# Patient Record
Sex: Female | Born: 1990 | Race: Black or African American | Hispanic: No | Marital: Single | Smoking: Never smoker
Health system: Southern US, Community
[De-identification: ages and names within clinical notes are randomized; demographics above are authoritative.]

## PROBLEM LIST (undated history)

## (undated) DIAGNOSIS — D649 Anemia, unspecified: Secondary | ICD-10-CM

## (undated) DIAGNOSIS — Z789 Other specified health status: Secondary | ICD-10-CM

## (undated) HISTORY — PX: NO PAST SURGERIES: SHX2092

---
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
## 2011-02-14 ENCOUNTER — Inpatient Hospital Stay: Payer: Self-pay | Admitting: Advanced Practice Midwife

## 2011-02-14 ENCOUNTER — Observation Stay: Payer: Self-pay

## 2011-06-21 ENCOUNTER — Emergency Department: Payer: Self-pay | Admitting: Emergency Medicine

## 2012-02-21 ENCOUNTER — Emergency Department
Admission: EM | Admit: 2012-02-21 | Discharge: 2012-02-21 | Disposition: A | Discharge status: Home Health | Payer: Medicaid Other | Source: Home / Self Care

## 2012-02-21 DIAGNOSIS — L739 Follicular disorder, unspecified: Secondary | ICD-10-CM

## 2012-02-21 DIAGNOSIS — L738 Other specified follicular disorders: Secondary | ICD-10-CM

## 2012-02-21 DIAGNOSIS — B9689 Other specified bacterial agents as the cause of diseases classified elsewhere: Secondary | ICD-10-CM

## 2012-02-21 MED ORDER — METRONIDAZOLE 500 MG PO TABS: 500.0000 mg | ORAL_TABLET | Freq: Two times a day (BID) | ORAL | Status: AC

## 2012-02-21 MED ORDER — MUPIROCIN 2 % EX OINT: TOPICAL_OINTMENT | Freq: Three times a day (TID) | CUTANEOUS | Status: AC

## 2012-02-21 NOTE — ED Notes (Signed)
Pt c/o painful lesion on L vagina.  Pt states she noticed it yesterday.  Describes as a "bump"  Pt states she attempted to express it with no success.  Pt states pain increases with walking, sitting.  Denies any discharge, itching or odor.

## 2012-02-21 NOTE — ED Provider Notes (Signed)
History     CSN: [id number]  Arrival date & time 02/21/12  6295   First MD Initiated Contact with Patient 02/21/12 1918      Chief Complaint  Patient presents with  . Groin Swelling    (Consider location/radiation/quality/duration/timing/severity/associated sxs/prior treatment) HPI Comments: Vaginal bump x 2 days.  Has noticed irritation and mild tenderness.  No fevers, chills.  No vaginal discharge.  No dysuria.  Recently shaved vaginal area.  Irritation is near area of shaving.  No prior history of genital warts, HSV.      History reviewed. No pertinent past medical history.  History reviewed. No pertinent past surgical history.  No family history on file.  History  Substance Use Topics  . Smoking status: Not on file  . Smokeless tobacco: Not on file  . Alcohol Use: No    OB History    Grav Para Term Preterm Abortions TAB SAB Ect Mult Living                  Review of Systems  All other systems reviewed and are negative.    Allergies  Review of patient's allergies indicates no known allergies.  Home Medications   Current Outpatient Rx  Name Route Sig Dispense Refill  . METRONIDAZOLE 500 MG PO TABS Oral Take 1 tablet (500 mg total) by mouth 2 (two) times daily. 14 tablet 0  . MUPIROCIN 2 % EX OINT Topical Apply topically 3 (three) times daily. 22 g 0    BP 114/79  Pulse 84  Temp 98.5 F (36.9 C) (Oral)  Resp 15  Ht 5' 1.5" (1.562 m)  Wt 124 lb (56.246 kg)  BMI 23.05 kg/m2  SpO2 100%  Physical Exam  Constitutional: She appears well-developed and well-nourished.  HENT:  Head: Normocephalic and atraumatic.  Eyes: Pupils are equal, round, and reactive to light.  Neck: Normal range of motion. Neck supple.  Cardiovascular: Normal rate and regular rhythm.   Pulmonary/Chest: Effort normal and breath sounds normal.  Abdominal: Soft. Bowel sounds are normal.  Genitourinary:          2 small denuded areas on L labia majora.  Mild TTP over  denuded areas No erythema or purulent drainage.  Marked fishy odor.     ED Course  Procedures (including critical care time)  Labs Reviewed - No data to display No results found.   1. Folliculitis   2. BV (bacterial vaginosis)       MDM  Will treat with warm compresses and topical bactroban.  Discussed shaving avoidance and skin care.  Case discussed with OB-GYN on call faculty.  Infectious red flags discussed.  Handout given.   Marked fishy odor on GYN exam. Will treat clinically with flagyl.  Handout given.  Follow up as needed.     The patient and/or caregiver has been counseled thoroughly with regard to treatment plan and/or medications prescribed including dosage, schedule, interactions, rationale for use, and possible side effects and they verbalize understanding. Diagnoses and expected course of recovery discussed and will return if not improved as expected or if the condition worsens. Patient and/or caregiver verbalized understanding.             Floydene Flock, MD 02/21/12 2013

## 2012-02-23 NOTE — ED Provider Notes (Signed)
Agree with exam, assessment, and plan.   Lattie Haw, MD 02/23/12 [phone]

## 2018-08-06 NOTE — L&D Delivery Note (Signed)
Delivery Note At 3:29 AM a viable female was delivered via Vaginal, Spontaneous (Presentation: ROA).  APGAR: 7, 8; weight pending.   Placenta status:spontanous, intact.  Cord: 3VC. Loose nuchal x 1 reduced on perineum without complications: .  Cord pH: N/A  Anesthesia:  Epidural Episiotomy: None Lacerations: None Suture Repair: none Est. Blood Loss (mL): 465  Mom to postpartum.  Baby to Couplet care / Skin to Skin.  Malachy Mood 02/01/2019, 3:58 AM

## 2018-10-24 ENCOUNTER — Encounter: Payer: Self-pay | Admitting: Advanced Practice Midwife

## 2018-11-04 ENCOUNTER — Ambulatory Visit (INDEPENDENT_AMBULATORY_CARE_PROVIDER_SITE_OTHER): Payer: Medicaid Other | Admitting: Advanced Practice Midwife

## 2018-11-04 ENCOUNTER — Other Ambulatory Visit: Payer: Self-pay

## 2018-11-04 ENCOUNTER — Encounter: Payer: Self-pay | Admitting: Advanced Practice Midwife

## 2018-11-04 DIAGNOSIS — Z3480 Encounter for supervision of other normal pregnancy, unspecified trimester: Secondary | ICD-10-CM

## 2018-11-04 DIAGNOSIS — Z3482 Encounter for supervision of other normal pregnancy, second trimester: Secondary | ICD-10-CM

## 2018-11-04 DIAGNOSIS — Z3481 Encounter for supervision of other normal pregnancy, first trimester: Secondary | ICD-10-CM

## 2018-11-04 NOTE — Progress Notes (Addendum)
New Obstetric Patient H&P    Chief Complaint: "Desires prenatal care"  Telephone interview: Patient is currently in her car and agrees to phone interview at this time I spoke with the patient from my office phone On call are Tresea MallJane Harles Evetts, CNM and [name] Time of call: 10:45 AM to 11:15 AM   History of Present Illness: Patient is a 28 y.o. [id number] Not Hispanic or Latino female, presents with amenorrhea and positive home pregnancy test. No LMP recorded (lmp unknown). Patient is pregnant. She has regular periods every 28 days lasting 4 days. She does not know when her last period was but thinks it may have been at the end of September or October making her approximately 22-[redacted] weeks pregnant. She has not taken a pregnancy test. She started feeling fetal movement about 1.5 months ago. Her last pap smear was 1 or 2 years ago at [facility] [place] and was no abnormalities. Will have patient sign release for records.   Since her LMP she claims she has experienced breast tenderness, fatigue, nausea. She denies vaginal bleeding. Her past medical history is noncontributory. Her prior pregnancies are notable for 2009 FT SVD female 7#, 2012 FT SVD female/SIDS at 97m, 532015 FT SVD female 6#, 2016 FT SVD female 6#  Since her LMP, she admits to the use of tobacco products  no She claims she has gained   "a few" pounds since the start of her pregnancy.  There are cats in the home  no  She admits close contact with children on a regular basis  yes  She has had chicken pox in the past unknown She has had Tuberculosis exposures, symptoms, or previously tested positive for TB   no Current or past history of domestic violence. no  Genetic Screening/Teratology Counseling: (Includes patient, baby's father, or anyone in either family with:)   1. [facility]  no 2. Thalassemia ([place], [place], Mediterranean, or Asian background): MCV<80  no 3. Neural tube defect  (meningomyelocele, spina bifida, anencephaly)  no 4. Congenital heart defect  no  5. Down syndrome  no 6. Tay-Sachs (Jewish, [place])  no 7. Canavan's Disease  no 8. Sickle cell disease or trait (African)  no  9. Hemophilia or other blood disorders  no  10. Muscular dystrophy  no  11. Cystic fibrosis  no  12. Huntington's Chorea  no  13. Mental retardation/autism  no 14. Other inherited genetic or chromosomal disorder  no 15. Maternal metabolic disorder (DM, PKU, etc)  no 16. Patient or FOB with a child with a birth defect not listed above no  16a. Patient or FOB with a birth defect themselves no 17. Recurrent pregnancy loss, or stillbirth  no  18. Any medications since LMP other than prenatal vitamins (include vitamins, supplements, OTC meds, drugs, alcohol)  no 19. Any other genetic/environmental exposure to discuss  no  Infection History:   1. Lives with someone with TB or TB exposed  no  2. Patient or partner has history of genital herpes  no 3. Rash or viral illness since LMP  no 4. History of STI (GC, CT, HPV, syphilis, HIV)  yes 5. History of recent travel :  no  Other pertinent information:  Patient is late to care due to uncertainty of keeping the pregnancy.     Review of Systems:10 point review of systems negative unless otherwise noted in HPI  Past Medical History:  History reviewed. No pertinent  past medical history.  Past Surgical History:  History reviewed. No pertinent surgical history.  Gynecologic History: No LMP recorded (lmp unknown). Patient is pregnant.  Obstetric History: [id number]  Family History:  Patient declined to speak in detail about family history Family History  Problem Relation Age of Onset  . Congestive Heart Failure Mother   . Diabetes Mother   . Diabetes Father     Social History:  Social History   Socioeconomic History  . Marital status: Single    Spouse name: Not on file  . Number of children: Not on file  . Years  of education: Not on file  . Highest education level: Not on file  Occupational History  . Not on file  Social Needs  . Financial resource strain: Not on file  . Food insecurity:    Worry: Not on file    Inability: Not on file  . Transportation needs:    Medical: Not on file    Non-medical: Not on file  Tobacco Use  . Smoking status: Never Smoker  . Smokeless tobacco: Never Used  Substance and Sexual Activity  . Alcohol use: No  . Drug use: No  . Sexual activity: Not on file  Lifestyle  . Physical activity:    Days per week: Not on file    Minutes per session: Not on file  . Stress: Not on file  Relationships  . Social connections:    Talks on phone: Not on file    Gets together: Not on file    Attends religious service: Not on file    Active member of club or organization: Not on file    Attends meetings of clubs or organizations: Not on file    Relationship status: Not on file  . Intimate partner violence:    Fear of current or ex partner: Not on file    Emotionally abused: Not on file    Physically abused: Not on file    Forced sexual activity: Not on file  Other Topics Concern  . Not on file  Social History Narrative  . Not on file    Allergies:  No Known Allergies  Medications: Prior to Admission medications   Not on File    Physical Exam Vitals: There were no vitals taken for this visit. Patient does not know her current weight Height 5' 1.5"  Telephone call/no physical exam Normal speaking voice, patient does not sound short of breath   Assessment: 28 y.o. [id number] at Unknown presenting to initiate prenatal care  Plan: 1) Avoid alcoholic beverages. 2) Patient encouraged not to smoke.  3) Discontinue the use of all non-medicinal drugs and chemicals.  4) Take prenatal vitamins daily.  5) Nutrition, food safety (fish, cheese advisories, and high nitrite foods) and exercise discussed. 6) Hospital and practice style discussed with cross coverage  system.  7) Genetic Screening, such as with 1st Trimester Screening, cell free fetal DNA, AFP testing, and Ultrasound, as well as with amniocentesis and CVS as appropriate, is discussed with patient. At the conclusion of today's visit patient declined genetic testing 8) Patient is asked about travel to areas at risk for the [place] virus, and counseled to avoid travel and exposure to mosquitoes or sexual partners who may have themselves been exposed to the virus. Testing is discussed, and will be ordered as appropriate.  9) In-office visit in 1 week for anatomy/dating scan, labs and ROB  Parke Poisson, CNM [facility] 11/04/18  4:24 PM

## 2018-11-12 ENCOUNTER — Other Ambulatory Visit (HOSPITAL_COMMUNITY)
Admission: RE | Admit: 2018-11-12 | Discharge: 2018-11-12 | Disposition: A | Discharge status: Home / Self Care | Payer: Medicaid Other | Source: Ambulatory Visit | Attending: Maternal Newborn | Admitting: Maternal Newborn

## 2018-11-12 ENCOUNTER — Encounter: Payer: Self-pay | Admitting: Maternal Newborn

## 2018-11-12 ENCOUNTER — Ambulatory Visit (INDEPENDENT_AMBULATORY_CARE_PROVIDER_SITE_OTHER): Payer: Medicaid Other

## 2018-11-12 ENCOUNTER — Other Ambulatory Visit: Payer: Self-pay

## 2018-11-12 ENCOUNTER — Ambulatory Visit (INDEPENDENT_AMBULATORY_CARE_PROVIDER_SITE_OTHER): Payer: Medicaid Other | Admitting: Maternal Newborn

## 2018-11-12 VITALS — BP 110/60 | Wt 149.0 lb

## 2018-11-12 DIAGNOSIS — Z3480 Encounter for supervision of other normal pregnancy, unspecified trimester: Secondary | ICD-10-CM | POA: Diagnosis not present

## 2018-11-12 DIAGNOSIS — Z3A26 26 weeks gestation of pregnancy: Secondary | ICD-10-CM | POA: Diagnosis not present

## 2018-11-12 DIAGNOSIS — O0932 Supervision of pregnancy with insufficient antenatal care, second trimester: Secondary | ICD-10-CM

## 2018-11-12 DIAGNOSIS — O0930 Supervision of pregnancy with insufficient antenatal care, unspecified trimester: Secondary | ICD-10-CM | POA: Insufficient documentation

## 2018-11-12 DIAGNOSIS — Z363 Encounter for antenatal screening for malformations: Secondary | ICD-10-CM

## 2018-11-12 LAB — POCT URINALYSIS DIPSTICK OB: Glucose, UA: NEGATIVE

## 2018-11-12 MED ORDER — CONCEPT DHA 53.5-38-1 MG PO CAPS: 1.0000 | ORAL_CAPSULE | Freq: Every day | ORAL | 4 refills | Status: DC

## 2018-11-12 NOTE — Progress Notes (Signed)
Routine Prenatal Care Visit  Subjective  Nicole Kaufman is a 28 y.o. [id number] at Unknown being seen today for ongoing prenatal care.  She is currently monitored for the following issues for this low-risk pregnancy and has Supervision of other normal pregnancy, antepartum on their problem list.  ----------------------------------------------------------------------------------- Patient reports no complaints.   Contractions: Not present. Vag. Bleeding: None.  Movement: Present. No leaking of fluid.  ----------------------------------------------------------------------------------- The following portions of the patient's history were reviewed and updated as appropriate: allergies, current medications, past family history, past medical history, past social history, past surgical history and problem list. Problem list updated.  Objective  Blood pressure 110/60, weight 149 lb (67.6 kg). Pregravid weight 140 lb (63.5 kg) Total Weight Gain 9 lb (4.082 kg) Body mass index is 27.7 kg/m.   Urinalysis: Urine dipstick shows negative for glucose, positive for protein (trace).  Fetal Status: Fetal Heart Rate (bpm): 154   Movement: Present  Presentation: Vertex  General:  Alert, oriented and cooperative. Patient is in no acute distress.  Skin: Skin is warm and dry. No rash noted.   Cardiovascular: Normal heart rate noted  Respiratory: Normal respiratory effort, no problems with respiration noted  Abdomen: Soft, gravid, appropriate for gestational age. Pain/Pressure: Absent     Pelvic:  Cervical exam deferred        Extremities: Normal range of motion.     Mental Status: Normal mood and affect. Normal behavior. Normal judgment and thought content.    Assessment   28 y.o. [id number] at 26w 1d by Ultrasound presenting for a routine prenatal visit.  Plan   Pregnancy # 5 Problems (from 11/04/18 to present)    Problem Noted Resolved   Supervision of other normal pregnancy, antepartum 11/04/2018  by Tresea MallGledhill, Jane, CNM No   Overview Addendum 11/04/2018  4:32 PM by Tresea MallGledhill, Jane, CNM    Clinic [facility] Prenatal Labs  Dating  Blood type:     Genetic Screen 1 Screen:    AFP:     Quad:     NIPS: Declined Antibody:   Anatomic US  Rubella:   Varicella: @VZVIGG @  GTT Early: NA Third trimester:  RPR:     Rhogam  HBsAg:     TDaP vaccine  Flu Shot: declined HIV:     Baby Food Formula                            GBS:   Contraception  [facility]ap: [facility]- sign for records [ ]   CBB     CS/VBAC NA   Support Person Friend Alda/contact person is her Steva ColderUncle Demond [phone] Late entry to care            Ultrasound today shows singleton IUP, dating 26w 1d, EDD 02/17/2019. Anatomy scan normal and complete, female infant in cephalic position.  Due to gestational age, will have NOB labs done with GTT in two weeks when she can return fasting.   Asked patient to sign records release today for Pap results.  Rx for Concept DHA vitamins, samples given. OTC medication list provided.  Discussed spacing of visits due to COVID-19 precautions; encouraged to stay in touch via MyChart/telephone for any concerns or questions. Written FAQ and more information about visit scheduling available in the After Visit Summary, which was printed and provided to patient at the conclusion of her visit.   Return in about 2 weeks (  around 11/26/2018) for ROB with GTT/labs.  Marcelyn Bruins, CNM 11/12/2018

## 2018-11-12 NOTE — Progress Notes (Signed)
ROB and U/S today- no concerns

## 2018-11-12 NOTE — Patient Instructions (Signed)
Given the current COVID-19 pandemic, our practice is making changes in how we are providing care to our patients. We are limiting in-person visits for the safety of all of our patients.   As a practice, we have met to discuss the best way to minimize visits, but still provide excellent care to our expecting mothers.  We have decided on the following visit structure for low-risk pregnancies.  Initial Pregnancy visit will be conducted as a telephone or web visit.  Between 10-14 weeks  there will be one in-person visit for an ultrasound, lab work, and genetic screening. 20 weeks in-person visit with an anatomy ultrasound  28 weeks in-person office visit for a 1-hour glucose test and a TDAP vaccination 32 weeks in-person office visit 34 weeks telephone visit 36 weeks in-person office visit for GBS, chlamydia, and gonorrhea testing 38 weeks in-person office visit 40 weeks in-person office visit  Understandably, some patients will require more visits than what is outlined above. Additional visits will be determined on a case-by-case basis.   We will, as always, be available for emergencies or to address concerns that might arise between in-person visits. We ask that you allow [place] the opportunity to address any concerns over the phone or through a virtual visit first. We will be available to return your phone calls throughout the day.   If you are able to purchase a scale, a blood pressure machine, and a home fetal doppler visits could be limited further. This will help decrease your exposure risks, but these purchases are not a necessity.   Things seem to change daily and there is the possibility that this structure could change, please be patient as we adapt to a new way of caring for patients.   Thank you for trusting [place] with your prenatal care. Our practice values you and looks forward to providing you with excellent care.   Sincerely,   [facility] [place]     COVID-19 and Your Pregnancy FAQ  How can I prevent infection with COVID-19 during my pregnancy? Social distancing is key. Please limit any interactions in public. Try and work from home if possible. Frequently wash your hands after touching possibly contaminated surfaces. Avoid touching your face.  Minimize trips to the store. Consider online ordering when possible.   Should I wear a mask? YES. It is recommended by the CDC that all people wear a cloth mask or facial covering in public. This will help reduce transmission as well as your risk or acquiring COVID-19. New studies are showing that even asymptomatic individuals can spread the virus from talking.   What are the symptoms of COVID-19? Fever (greater than 100.4 F), dry cough, shortness of breath.  Am I more at risk for COVID-19 since I am pregnant? There is not currently data showing that pregnant women are more adversely impacted by COVID-19 than the general population. However, we know that pregnant women tend to have worse respiratory complications from similar diseases such as the flu and SARS and for this reason should be considered an at-risk population.  What do I do if I am experiencing the symptoms of COVID-19? Testing is being limited because of test availability. If you are experiencing symptoms you should quarantine yourself, and the members of your family, for at least 2 weeks at home.   Please visit this website for more information: [email or web address].za.html  When should I go to the Emergency Room? Please go to the emergency room if you are  experiencing ANY of these symptoms*:  1.    Difficulty breathing or shortness of breath 2.    Persistent pain or pressure in the chest 3.    Confusion or difficulty being aroused (or awakened) 4.    Bluish lips or face  *This list is not all inclusive. Please consult our office for any other symptoms that are severe or  concerning.  What do I do if I am having difficulty breathing? You should go to the Emergency Room for evaluation. At this time they have a tent set up for evaluating patients with COVID-19 symptoms.   How will my prenatal care be different because of the COVID-19 pandemic? It has been recommended to reduce the frequency of face-to-face visits and use resources such as telephone and virtual visits when possible. Using a scale, blood pressure machine and fetal doppler at home can further help reduce face-to-face visits. You will be provided with additional information on this topic.  We ask that you come to your visits alone to minimize potential exposures to  COVID-19.  How can I receive childbirth education? At this time in-person classes have been cancelled. You can register for online childbirth education, breastfeeding, and newborn care classes.  Please visit:  CyberComps.hu for more information  How will my hospital birth experience be different? The hospital is currently limiting visitors. This means that while you are in labor you can only have one person at the hospital with you. Additional family members will not be allowed to wait in the building or outside your room. Your one support person can be the father of the baby, a relative, a doula, or a friend. Once one support person is designated that person will wear a band. This band cannot be shared with multiple people.  How long will I stay in the hospital for after giving birth? It is also recommended that discharge home be expedited during the COVID-19 outbreak. This means staying for 1 day after a vaginal delivery and 2 days after a cesarean section.  What if I have COVID-19 and I am in labor? We ask that you wear a mask while on labor and delivery. We will try and accommodate you being placed in a room that is capable of filtering the air. Please call ahead if you are in labor and on your way to the hospital. The  phone number for labor and delivery at [facility] is [phone].  If I have COVID-19 when my baby is born how can I prevent my baby from contracting COVID-19? This is an issue that will have to be discussed on a case-by-case basis. Current recommendations suggest providing separate isolation rooms for both the mother and new infant as well as limiting visitors. However, there are practical challenges to this recommendation. The situation will assuredly change and decisions will be influenced by the desires of the mother and availability of space.  Some suggestions are the use of a curtain or physical barrier between mom and infant, hand hygiene, mom wearing a mask, or 6 feet of spacing between a mom and infant.   Can I breastfeed during the COVID-19 pandemic?   Yes, breastfeeding is encouraged.  Can I breastfeed if I have COVID-19? Yes. Covid-19 has not been found in breast milk. This means you cannot give COVID-19 to your child through breast milk. Breast feeding will also help pass antibodies to fight infection to your baby.   What precautions should I take when breastfeeding if I  have COVID-19? If a mother and newborn do room-in and the mother wishes to feed at the breast, she should put on a facemask and practice hand hygiene before each feeding.  What precautions should I take when pumping if I have COVID-19? Prior to expressing breast milk, mothers should practice hand hygiene. After each pumping session, all parts that come into contact with breast milk should be thoroughly washed and the entire pump should be appropriately disinfected per the manufacturer's instructions. This expressed breast milk should be fed to the newborn by a healthy caregiver.  What if I am pregnant and work in healthcare? Based on limited data regarding COVID-19 and pregnancy, ACOG currently does not propose creating additional restrictions on pregnant health care personnel because of  COVID-19 alone. Pregnant women do not appear to be at higher risk of severe disease related to COVID-19. Pregnant health care personnel should follow CDC risk assessment and infection control guidelines for health care personnel exposed to patients with suspected or confirmed COVID-19. Adherence to recommended infection prevention and control practices is an important part of protecting all health care personnel in health care settings.    Information on COVID-19 in pregnancy is very limited; however, facilities may want to consider limiting exposure of pregnant health care personnel to patients with confirmed or suspected COVID-19 infection, especially during higher-risk procedures (eg, aerosol-generating procedures), if feasible, based on staffing availability.

## 2018-11-14 LAB — URINE CULTURE

## 2018-11-15 LAB — URINE DRUG PANEL 7
Amphetamines, Urine: NEGATIVE ng/mL
Barbiturate Quant, Ur: NEGATIVE ng/mL
Benzodiazepine Quant, Ur: NEGATIVE ng/mL
Cannabinoid Quant, Ur: NEGATIVE ng/mL
Cocaine (Metab.): NEGATIVE ng/mL
Opiate Quant, Ur: NEGATIVE
PCP Quant, Ur: NEGATIVE ng/mL

## 2018-11-17 LAB — URINE CYTOLOGY ANCILLARY ONLY
Chlamydia: POSITIVE — AB
Neisseria Gonorrhea: NEGATIVE

## 2018-11-18 ENCOUNTER — Other Ambulatory Visit: Payer: Self-pay | Admitting: Maternal Newborn

## 2018-11-18 DIAGNOSIS — A749 Chlamydial infection, unspecified: Secondary | ICD-10-CM

## 2018-11-18 MED ORDER — AZITHROMYCIN 500 MG PO TABS: 1000.0000 mg | ORAL_TABLET | Freq: Once | ORAL | 0 refills | Status: AC

## 2018-11-18 NOTE — Progress Notes (Signed)
Rx sent for azithromycin. Patient aware and counseled about partner treatment.

## 2018-11-26 ENCOUNTER — Other Ambulatory Visit: Payer: Medicaid Other

## 2018-11-26 ENCOUNTER — Encounter: Payer: Medicaid Other | Admitting: Obstetrics & Gynecology

## 2018-11-27 ENCOUNTER — Other Ambulatory Visit: Payer: Medicaid Other

## 2018-11-27 ENCOUNTER — Encounter: Payer: Medicaid Other | Admitting: Advanced Practice Midwife

## 2018-12-01 ENCOUNTER — Ambulatory Visit (INDEPENDENT_AMBULATORY_CARE_PROVIDER_SITE_OTHER): Payer: Medicaid Other | Admitting: Advanced Practice Midwife

## 2018-12-01 ENCOUNTER — Encounter: Payer: Self-pay | Admitting: Advanced Practice Midwife

## 2018-12-01 ENCOUNTER — Other Ambulatory Visit: Payer: Self-pay

## 2018-12-01 ENCOUNTER — Other Ambulatory Visit: Payer: Medicaid Other

## 2018-12-01 VITALS — BP 110/64 | Wt 153.0 lb

## 2018-12-01 DIAGNOSIS — Z3A28 28 weeks gestation of pregnancy: Secondary | ICD-10-CM | POA: Diagnosis not present

## 2018-12-01 DIAGNOSIS — O0933 Supervision of pregnancy with insufficient antenatal care, third trimester: Secondary | ICD-10-CM

## 2018-12-01 DIAGNOSIS — Z3480 Encounter for supervision of other normal pregnancy, unspecified trimester: Secondary | ICD-10-CM

## 2018-12-01 LAB — OB RESULTS CONSOLE VARICELLA ZOSTER ANTIBODY, IGG: Varicella: IMMUNE

## 2018-12-01 LAB — POCT URINALYSIS DIPSTICK OB: Glucose, UA: NEGATIVE

## 2018-12-01 NOTE — Patient Instructions (Signed)
Third Trimester of Pregnancy The third trimester is from week 28 through week 40 (months 7 through 9). The third trimester is a time when the unborn baby (fetus) is growing rapidly. At the end of the ninth month, the fetus is about 20 inches in length and weighs 6-10 pounds. Body changes during your third trimester Your body will continue to go through many changes during pregnancy. The changes vary from woman to woman. During the third trimester:  Your weight will continue to increase. You can expect to gain 25-35 pounds (11-16 kg) by the end of the pregnancy.  You may begin to get stretch marks on your hips, abdomen, and breasts.  You may urinate more often because the fetus is moving lower into your pelvis and pressing on your bladder.  You may develop or continue to have heartburn. This is caused by increased hormones that slow down muscles in the digestive tract.  You may develop or continue to have constipation because increased hormones slow digestion and cause the muscles that push waste through your intestines to relax.  You may develop hemorrhoids. These are swollen veins (varicose veins) in the rectum that can itch or be painful.  You may develop swollen, bulging veins (varicose veins) in your legs.  You may have increased body aches in the pelvis, back, or thighs. This is due to weight gain and increased hormones that are relaxing your joints.  You may have changes in your hair. These can include thickening of your hair, rapid growth, and changes in texture. Some women also have hair loss during or after pregnancy, or hair that feels dry or thin. Your hair will most likely return to normal after your baby is born.  Your breasts will continue to grow and they will continue to become tender. A yellow fluid (colostrum) may leak from your breasts. This is the first milk you are producing for your baby.  Your belly button may stick out.  You may notice more swelling in your hands,  face, or ankles.  You may have increased tingling or numbness in your hands, arms, and legs. The skin on your belly may also feel numb.  You may feel short of breath because of your expanding uterus.  You may have more problems sleeping. This can be caused by the size of your belly, increased need to urinate, and an increase in your body's metabolism.  You may notice the fetus "dropping," or moving lower in your abdomen (lightening).  You may have increased vaginal discharge.  You may notice your joints feel loose and you may have pain around your pelvic bone. What to expect at prenatal visits You will have prenatal exams every 2 weeks until week 36. Then you will have weekly prenatal exams. During a routine prenatal visit:  You will be weighed to make sure you and the baby are growing normally.  Your blood pressure will be taken.  Your abdomen will be measured to track your baby's growth.  The fetal heartbeat will be listened to.  Any test results from the previous visit will be discussed.  You may have a cervical check near your due date to see if your cervix has softened or thinned (effaced).  You will be tested for Group B streptococcus. This happens between 35 and 37 weeks. Your health care provider may ask you:  What your birth plan is.  How you are feeling.  If you are feeling the baby move.  If you have had any abnormal   symptoms, such as leaking fluid, bleeding, severe headaches, or abdominal cramping.  If you are using any tobacco products, including cigarettes, chewing tobacco, and electronic cigarettes.  If you have any questions. Other tests or screenings that may be performed during your third trimester include:  Blood tests that check for low iron levels (anemia).  Fetal testing to check the health, activity level, and growth of the fetus. Testing is done if you have certain medical conditions or if there are problems during the pregnancy.  Nonstress test  (NST). This test checks the health of your baby to make sure there are no signs of problems, such as the baby not getting enough oxygen. During this test, a belt is placed around your belly. The baby is made to move, and its heart rate is monitored during movement. What is false labor? False labor is a condition in which you feel small, irregular tightenings of the muscles in the womb (contractions) that usually go away with rest, changing position, or drinking water. These are called Braxton Hicks contractions. Contractions may last for hours, days, or even weeks before true labor sets in. If contractions come at regular intervals, become more frequent, increase in intensity, or become painful, you should see your health care provider. What are the signs of labor?  Abdominal cramps.  Regular contractions that start at 10 minutes apart and become stronger and more frequent with time.  Contractions that start on the top of the uterus and spread down to the lower abdomen and back.  Increased pelvic pressure and dull back pain.  A watery or bloody mucus discharge that comes from the vagina.  Leaking of amniotic fluid. This is also known as your "water breaking." It could be a slow trickle or a gush. Let your health care provider know if it has a color or strange odor. If you have any of these signs, call your health care provider right away, even if it is before your due date. Follow these instructions at home: Medicines  Follow your health care provider's instructions regarding medicine use. Specific medicines may be either safe or unsafe to take during pregnancy.  Take a prenatal vitamin that contains at least 600 micrograms (mcg) of folic acid.  If you develop constipation, try taking a stool softener if your health care provider approves. Eating and drinking   Eat a balanced diet that includes fresh fruits and vegetables, whole grains, good sources of protein such as meat, eggs, or tofu,  and low-fat dairy. Your health care provider will help you determine the amount of weight gain that is right for you.  Avoid raw meat and uncooked cheese. These carry germs that can cause birth defects in the baby.  If you have low calcium intake from food, talk to your health care provider about whether you should take a daily calcium supplement.  Eat four or five small meals rather than three large meals a day.  Limit foods that are high in fat and processed sugars, such as fried and sweet foods.  To prevent constipation: ? Drink enough fluid to keep your urine clear or pale yellow. ? Eat foods that are high in fiber, such as fresh fruits and vegetables, whole grains, and beans. Activity  Exercise only as directed by your health care provider. Most women can continue their usual exercise routine during pregnancy. Try to exercise for 30 minutes at least 5 days a week. Stop exercising if you experience uterine contractions.  Avoid heavy lifting.  Do   not exercise in extreme heat or humidity, or at high altitudes.  Wear low-heel, comfortable shoes.  Practice good posture.  You may continue to have sex unless your health care provider tells you otherwise. Relieving pain and discomfort  Take frequent breaks and rest with your legs elevated if you have leg cramps or low back pain.  Take warm sitz baths to soothe any pain or discomfort caused by hemorrhoids. Use hemorrhoid cream if your health care provider approves.  Wear a good support bra to prevent discomfort from breast tenderness.  If you develop varicose veins: ? Wear support pantyhose or compression stockings as told by your healthcare provider. ? Elevate your feet for 15 minutes, 3-4 times a day. Prenatal care  Write down your questions. Take them to your prenatal visits.  Keep all your prenatal visits as told by your health care provider. This is important. Safety  Wear your seat belt at all times when driving.  Make  a list of emergency phone numbers, including numbers for family, friends, the hospital, and police and fire departments. General instructions  Avoid cat litter boxes and soil used by cats. These carry germs that can cause birth defects in the baby. If you have a cat, ask someone to clean the litter box for you.  Do not travel far distances unless it is absolutely necessary and only with the approval of your health care provider.  Do not use hot tubs, steam rooms, or saunas.  Do not drink alcohol.  Do not use any products that contain nicotine or tobacco, such as cigarettes and e-cigarettes. If you need help quitting, ask your health care provider.  Do not use any medicinal herbs or unprescribed drugs. These chemicals affect the formation and growth of the baby.  Do not douche or use tampons or scented sanitary pads.  Do not cross your legs for long periods of time.  To prepare for the arrival of your baby: ? Take prenatal classes to understand, practice, and ask questions about labor and delivery. ? Make a trial run to the hospital. ? Visit the hospital and tour the maternity area. ? Arrange for maternity or paternity leave through employers. ? Arrange for family and friends to take care of pets while you are in the hospital. ? Purchase a rear-facing car seat and make sure you know how to install it in your car. ? Pack your hospital bag. ? Prepare the baby's nursery. Make sure to remove all pillows and stuffed animals from the baby's crib to prevent suffocation.  Visit your dentist if you have not gone during your pregnancy. Use a soft toothbrush to brush your teeth and be gentle when you floss. Contact a health care provider if:  You are unsure if you are in labor or if your water has broken.  You become dizzy.  You have mild pelvic cramps, pelvic pressure, or nagging pain in your abdominal area.  You have lower back pain.  You have persistent nausea, vomiting, or  diarrhea.  You have an unusual or bad smelling vaginal discharge.  You have pain when you urinate. Get help right away if:  Your water breaks before 37 weeks.  You have regular contractions less than 5 minutes apart before 37 weeks.  You have a fever.  You are leaking fluid from your vagina.  You have spotting or bleeding from your vagina.  You have severe abdominal pain or cramping.  You have rapid weight loss or weight gain.  You have   shortness of breath with chest pain.  You notice sudden or extreme swelling of your face, hands, ankles, feet, or legs.  Your baby makes fewer than 10 movements in 2 hours.  You have severe headaches that do not go away when you take medicine.  You have vision changes. Summary  The third trimester is from week 28 through week 40, months 7 through 9. The third trimester is a time when the unborn baby (fetus) is growing rapidly.  During the third trimester, your discomfort may increase as you and your baby continue to gain weight. You may have abdominal, leg, and back pain, sleeping problems, and an increased need to urinate.  During the third trimester your breasts will keep growing and they will continue to become tender. A yellow fluid (colostrum) may leak from your breasts. This is the first milk you are producing for your baby.  False labor is a condition in which you feel small, irregular tightenings of the muscles in the womb (contractions) that eventually go away. These are called Braxton Hicks contractions. Contractions may last for hours, days, or even weeks before true labor sets in.  Signs of labor can include: abdominal cramps; regular contractions that start at 10 minutes apart and become stronger and more frequent with time; watery or bloody mucus discharge that comes from the vagina; increased pelvic pressure and dull back pain; and leaking of amniotic fluid. This information is not intended to replace advice given to you by your  health care provider. Make sure you discuss any questions you have with your health care provider. Document Released: 07/17/2001 Document Revised: 08/28/2016 Document Reviewed: 08/28/2016 Elsevier Interactive Patient Education  2019 [employer]  

## 2018-12-01 NOTE — Progress Notes (Signed)
ROB/GTT- no concerns 

## 2018-12-01 NOTE — Progress Notes (Signed)
  Routine Prenatal Care Visit  Subjective  Nicole Kaufman is a 28 y.o. [id number] at [redacted]w[redacted]d being seen today for ongoing prenatal care.  She is currently monitored for the following issues for this low-risk pregnancy and has Supervision of other normal pregnancy, antepartum and Late prenatal care on their problem list.  ----------------------------------------------------------------------------------- Patient reports no complaints.   Contractions: Not present. Vag. Bleeding: None.  Movement: Present. Denies leaking of fluid.  ----------------------------------------------------------------------------------- The following portions of the patient's history were reviewed and updated as appropriate: allergies, current medications, past family history, past medical history, past social history, past surgical history and problem list. Problem list updated.   Objective  Blood pressure 110/64, weight 153 lb (69.4 kg). Pregravid weight 140 lb (63.5 kg) Total Weight Gain 13 lb (5.897 kg) Urinalysis: Urine Protein Trace  Urine Glucose Negative  Fetal Status: Fetal Heart Rate (bpm): 158 Fundal Height: 29 cm Movement: Present     General:  Alert, oriented and cooperative. Patient is in no acute distress.  Skin: Skin is warm and dry. No rash noted.   Cardiovascular: Normal heart rate noted  Respiratory: Normal respiratory effort, no problems with respiration noted  Abdomen: Soft, gravid, appropriate for gestational age. Pain/Pressure: Absent     Pelvic:  Cervical exam deferred        Extremities: Normal range of motion.  Edema: None  Mental Status: Normal mood and affect. Normal behavior. Normal judgment and thought content.   Assessment   28 y.o. [id number] at [redacted]w[redacted]d by  02/17/2019, by Ultrasound presenting for routine prenatal visit  Plan   Pregnancy # 5 Problems (from 11/04/18 to present)    Problem Noted Resolved   Supervision of other normal pregnancy, antepartum 11/04/2018 by Tresea Mall,  CNM No   Overview Addendum 11/12/2018 12:20 PM by Oswaldo Conroy, CNM    Clinic [facility] Prenatal Labs  Dating 26w 1d Ultrasound Blood type:     Genetic Screen 1 Screen:    AFP:     Quad:     NIPS: Declined Antibody:   Anatomic [place] Complete and normal, female 11/12/2018 Rubella:   Varicella: @VZVIGG @  GTT Early: NA Third trimester:  RPR:     Rhogam  HBsAg:     TDaP vaccine  Flu Shot: declined HIV:     Baby Food Formula                            GBS:   Contraception  Pap: 1 or 2 years ago at [facility]- sign for records [x]   CBB     CS/VBAC NA   Support Person Friend Alda/contact person is her Steva Colder [phone] Late entry to care             Preterm labor symptoms and general obstetric precautions including but not limited to vaginal bleeding, contractions, leaking of fluid and fetal movement were reviewed in detail with the patient. Please refer to After Visit Summary for other counseling recommendations.   Return in about 4 weeks (around 12/29/2018) for rob.  Tresea Mall, CNM 12/01/2018 3:55 PM

## 2018-12-02 LAB — RPR+RH+ABO+RUB AB+AB SCR+CB...
Antibody Screen: NEGATIVE
HIV Screen 4th Generation wRfx: NONREACTIVE
Hematocrit: 31.2 % — ABNORMAL LOW (ref 34.0–46.6)
Hemoglobin: 10 g/dL — ABNORMAL LOW (ref 11.1–15.9)
Hepatitis B Surface Ag: NEGATIVE
MCH: 24.7 pg — ABNORMAL LOW (ref 26.6–33.0)
MCHC: 32.1 g/dL (ref 31.5–35.7)
MCV: 77 fL — ABNORMAL LOW (ref 79–97)
Platelets: 195 10*3/uL (ref 150–450)
RBC: 4.05 x10E6/uL (ref 3.77–5.28)
RDW: 12.4 % (ref 11.7–15.4)
RPR Ser Ql: NONREACTIVE
Rh Factor: POSITIVE
Rubella Antibodies, IGG: 1.22 index (ref >0.99)
Varicella zoster IgG: 1291 index (ref >165)
WBC: 14.3 10*3/uL — ABNORMAL HIGH (ref 3.4–10.8)

## 2018-12-02 LAB — GLUCOSE, 1 HOUR GESTATIONAL: Gestational Diabetes Screen: 63 mg/dL — ABNORMAL LOW (ref 65–139)

## 2018-12-10 ENCOUNTER — Other Ambulatory Visit: Payer: Self-pay | Admitting: Maternal Newborn

## 2018-12-10 DIAGNOSIS — O99019 Anemia complicating pregnancy, unspecified trimester: Secondary | ICD-10-CM

## 2018-12-10 DIAGNOSIS — Z3480 Encounter for supervision of other normal pregnancy, unspecified trimester: Secondary | ICD-10-CM

## 2018-12-10 MED ORDER — FERROUS SULFATE 325 (65 FE) MG PO TABS: 325.0000 mg | ORAL_TABLET | Freq: Every day | ORAL | 3 refills | Status: DC

## 2018-12-10 NOTE — Progress Notes (Signed)
Rx for ferrous sulfate.

## 2018-12-30 ENCOUNTER — Encounter: Payer: Medicaid Other | Admitting: Obstetrics and Gynecology

## 2019-01-31 ENCOUNTER — Other Ambulatory Visit: Payer: Self-pay

## 2019-01-31 ENCOUNTER — Encounter: Payer: Self-pay | Admitting: Obstetrics and Gynecology

## 2019-01-31 ENCOUNTER — Inpatient Hospital Stay
Admission: EM | Admit: 2019-01-31 | Discharge: 2019-02-02 | DRG: 806 | Disposition: A | Discharge status: Home / Self Care | Payer: Medicaid Other | Attending: Obstetrics and Gynecology | Admitting: Obstetrics and Gynecology

## 2019-01-31 DIAGNOSIS — Z3A37 37 weeks gestation of pregnancy: Secondary | ICD-10-CM

## 2019-01-31 DIAGNOSIS — O0933 Supervision of pregnancy with insufficient antenatal care, third trimester: Secondary | ICD-10-CM

## 2019-01-31 DIAGNOSIS — O26893 Other specified pregnancy related conditions, third trimester: Secondary | ICD-10-CM | POA: Diagnosis present

## 2019-01-31 DIAGNOSIS — O0930 Supervision of pregnancy with insufficient antenatal care, unspecified trimester: Secondary | ICD-10-CM

## 2019-01-31 DIAGNOSIS — D62 Acute posthemorrhagic anemia: Secondary | ICD-10-CM | POA: Diagnosis not present

## 2019-01-31 DIAGNOSIS — Z1159 Encounter for screening for other viral diseases: Secondary | ICD-10-CM | POA: Diagnosis not present

## 2019-01-31 DIAGNOSIS — O9081 Anemia of the puerperium: Secondary | ICD-10-CM | POA: Diagnosis not present

## 2019-01-31 DIAGNOSIS — O4202 Full-term premature rupture of membranes, onset of labor within 24 hours of rupture: Secondary | ICD-10-CM

## 2019-01-31 DIAGNOSIS — Z3480 Encounter for supervision of other normal pregnancy, unspecified trimester: Secondary | ICD-10-CM

## 2019-01-31 HISTORY — DX: Other specified health status: Z78.9

## 2019-01-31 LAB — CBC
HCT: 35.9 % — ABNORMAL LOW (ref 36.0–46.0)
Hemoglobin: 11.1 g/dL — ABNORMAL LOW (ref 12.0–15.0)
MCH: 24.4 pg — ABNORMAL LOW (ref 26.0–34.0)
MCHC: 30.9 g/dL (ref 30.0–36.0)
MCV: 79.1 fL — ABNORMAL LOW (ref 80.0–100.0)
Platelets: 188 10*3/uL (ref 150–400)
RBC: 4.54 MIL/uL (ref 3.87–5.11)
RDW: 12.7 % (ref 11.5–15.5)
WBC: 16.2 10*3/uL — ABNORMAL HIGH (ref 4.0–10.5)
nRBC: 0 % (ref 0.0–0.2)

## 2019-01-31 LAB — URINE DRUG SCREEN, QUALITATIVE (ARMC ONLY)
Amphetamines, Ur Screen: POSITIVE — AB
Barbiturates, Ur Screen: NOT DETECTED
Benzodiazepine, Ur Scrn: NOT DETECTED
Cannabinoid 50 Ng, Ur ~~LOC~~: NOT DETECTED
Cocaine Metabolite,Ur ~~LOC~~: NOT DETECTED
MDMA (Ecstasy)Ur Screen: NOT DETECTED
Methadone Scn, Ur: NOT DETECTED
Opiate, Ur Screen: POSITIVE — AB
Phencyclidine (PCP) Ur S: NOT DETECTED
Tricyclic, Ur Screen: NOT DETECTED

## 2019-01-31 LAB — CHLAMYDIA/NGC RT PCR (ARMC ONLY)??????????
Chlamydia Tr: NOT DETECTED
N gonorrhoeae: NOT DETECTED

## 2019-01-31 LAB — SARS CORONAVIRUS 2 BY RT PCR (HOSPITAL ORDER, PERFORMED IN ~~LOC~~ HOSPITAL LAB): SARS Coronavirus 2: NEGATIVE

## 2019-01-31 LAB — CHLAMYDIA/NGC RT PCR (ARMC ONLY)

## 2019-01-31 LAB — TYPE AND SCREEN
ABO/RH(D): B POS
Antibody Screen: NEGATIVE

## 2019-01-31 LAB — OB RESULTS CONSOLE GC/CHLAMYDIA: Chlamydia: NEGATIVE

## 2019-01-31 MED ORDER — TERBUTALINE SULFATE 1 MG/ML IJ SOLN: 0.2500 mg | Freq: Once | INTRAMUSCULAR | Status: DC | PRN

## 2019-01-31 MED ORDER — LACTATED RINGERS IV SOLN
INTRAVENOUS | Status: DC
  Administered 2019-01-31: 18:00:00 via INTRAVENOUS

## 2019-01-31 MED ORDER — OXYTOCIN 40 UNITS IN NORMAL SALINE INFUSION - SIMPLE MED
1.0000 m[IU]/min | INTRAVENOUS | Status: DC
  Administered 2019-01-31: 2 m[IU]/min via INTRAVENOUS

## 2019-01-31 MED ORDER — LACTATED RINGERS IV SOLN: 500.0000 mL | INTRAVENOUS | Status: DC | PRN

## 2019-01-31 MED ORDER — OXYTOCIN 40 UNITS IN NORMAL SALINE INFUSION - SIMPLE MED
2.5000 [IU]/h | INTRAVENOUS | Status: DC
  Filled 2019-01-31: qty 1000

## 2019-01-31 MED ORDER — LIDOCAINE HCL (PF) 1 % IJ SOLN
30.0000 mL | INTRAMUSCULAR | Status: AC | PRN
  Administered 2019-02-01: 1.5 mL via SUBCUTANEOUS

## 2019-01-31 MED ORDER — PENICILLIN G 3 MILLION UNITS IVPB - SIMPLE MED
3.0000 10*6.[IU] | INTRAVENOUS | Status: DC
  Administered 2019-01-31 – 2019-02-01 (×2): 3 10*6.[IU] via INTRAVENOUS
  Filled 2019-01-31: qty 3
  Filled 2019-01-31 (×2): qty 100

## 2019-01-31 MED ORDER — AMMONIA AROMATIC IN INHA
RESPIRATORY_TRACT | Status: AC
  Filled 2019-01-31: qty 10

## 2019-01-31 MED ORDER — OXYTOCIN BOLUS FROM INFUSION
500.0000 mL | Freq: Once | INTRAVENOUS | Status: AC
  Administered 2019-02-01: 500 mL via INTRAVENOUS

## 2019-01-31 MED ORDER — LIDOCAINE HCL (PF) 1 % IJ SOLN
INTRAMUSCULAR | Status: AC
  Filled 2019-01-31: qty 30

## 2019-01-31 MED ORDER — BUTORPHANOL TARTRATE 2 MG/ML IJ SOLN
1.0000 mg | INTRAMUSCULAR | Status: DC | PRN
  Filled 2019-01-31: qty 1

## 2019-01-31 MED ORDER — ONDANSETRON HCL 4 MG/2ML IJ SOLN: 4.0000 mg | Freq: Four times a day (QID) | INTRAMUSCULAR | Status: DC | PRN

## 2019-01-31 MED ORDER — ACETAMINOPHEN 325 MG PO TABS: 650.0000 mg | ORAL_TABLET | ORAL | Status: DC | PRN

## 2019-01-31 MED ORDER — OXYTOCIN 10 UNIT/ML IJ SOLN
INTRAMUSCULAR | Status: AC
  Filled 2019-01-31: qty 2

## 2019-01-31 MED ORDER — MISOPROSTOL 200 MCG PO TABS
ORAL_TABLET | ORAL | Status: AC
  Filled 2019-01-31: qty 4

## 2019-01-31 MED ORDER — SOD CITRATE-CITRIC ACID 500-334 MG/5ML PO SOLN: 30.0000 mL | ORAL | Status: DC | PRN

## 2019-01-31 MED ORDER — SODIUM CHLORIDE 0.9 % IV SOLN
5.0000 10*6.[IU] | Freq: Once | INTRAVENOUS | Status: AC
  Administered 2019-01-31: 5 10*6.[IU] via INTRAVENOUS
  Filled 2019-01-31: qty 5

## 2019-01-31 NOTE — Progress Notes (Signed)
   Subjective:  No concerns, starting to feel more discomfort with contractions  Objective:   Vitals: Blood pressure 129/86, pulse 98, temperature 98.3 F (36.8 C), resp. rate 15, height 5\' 3"  (1.6 m), weight 68 kg. General: NAD Abdomen: Gravid, non-tender Cervical Exam:  Dilation: 3 Effacement (%): 50 Cervical Position: Middle Station: -2 Presentation: Vertex Exam by:: Steabler MD  FHT: 130, moderate, +accels, no decels Toco: q1-54min  Results for orders placed or performed during the hospital encounter of 01/31/19 (from the past 24 hour(s))  OB RESULTS CONSOLE GC/Chlamydia     Status: None   Collection Time: 01/31/19 12:00 AM  Result Value Ref Range   Chlamydia Negative   Urine Drug Screen, Qualitative ([place] only)     Status: Abnormal   Collection Time: 01/31/19  4:45 PM  Result Value Ref Range   Tricyclic, Ur Screen NONE DETECTED NONE DETECTED   Amphetamines, Ur Screen POSITIVE (A) NONE DETECTED   MDMA (Ecstasy)Ur Screen NONE DETECTED NONE DETECTED   Cocaine Metabolite,Ur [place] NONE DETECTED NONE DETECTED   Opiate, Ur Screen POSITIVE (A) NONE DETECTED   Phencyclidine (PCP) Ur S NONE DETECTED NONE DETECTED   Cannabinoid 50 Ng, Ur [place] NONE DETECTED NONE DETECTED   Barbiturates, Ur Screen NONE DETECTED NONE DETECTED   Benzodiazepine, Ur Scrn NONE DETECTED NONE DETECTED   Methadone Scn, Ur NONE DETECTED NONE DETECTED  Chlamydia/NGC rt PCR ([facility] only)     Status: None   Collection Time: 01/31/19  4:45 PM   Specimen: Vaginal/Rectal  Result Value Ref Range   Specimen source GC/Chlam URINE, RANDOM    Chlamydia Tr NOT DETECTED NOT DETECTED   N gonorrhoeae NOT DETECTED NOT DETECTED  SARS Coronavirus 2 (CEPHEID - Performed in [place] hospital lab), Hosp Order     Status: None   Collection Time: 01/31/19  5:15 PM   Specimen: Nasopharyngeal Swab  Result Value Ref Range   SARS Coronavirus 2 NEGATIVE NEGATIVE  CBC     Status: Abnormal   Collection Time: 01/31/19  5:34 PM   Result Value Ref Range   WBC 16.2 (H) 4.0 - 10.5 K/uL   RBC 4.54 3.87 - 5.11 MIL/uL   Hemoglobin 11.1 (L) 12.0 - 15.0 g/dL   HCT 35.9 (L) 36.0 - 46.0 %   MCV 79.1 (L) 80.0 - 100.0 fL   MCH 24.4 (L) 26.0 - 34.0 pg   MCHC 30.9 30.0 - 36.0 g/dL   RDW 12.7 11.5 - 15.5 %   Platelets 188 150 - 400 K/uL   nRBC 0.0 0.0 - 0.2 %  Type and screen     Status: None   Collection Time: 01/31/19  6:52 PM  Result Value Ref Range   ABO/RH(D) B POS    Antibody Screen NEG    Sample Expiration      02/03/2019,2359 Performed at [facility], [address] [place], [place] [address]     Assessment:   28 y.o. [id number] [redacted]w[redacted]d augmentation SROM  Plan:   1) Labor - continue to titrate pitocin  2) Fetus - cat I tracing  Malachy Mood, MD, [place], [facility] 01/31/2019, 10:31 PM

## 2019-01-31 NOTE — OB Triage Note (Signed)
Pt is a 28yo G4P3 at [redacted]w[redacted]d that presents from home with c/o LOF that started 2 days ago. Pt states for 2 days her panties have been a little wet but today around 1530 she had a huge gush of clear fluid that wouldn't stop. Pt denies VB, and states positive FM. Pt denies any other issues this pregnancy but has had limited prenatal care. Vitals stable and initial FHT 145 with monitors applied and assessing.

## 2019-01-31 NOTE — H&P (Signed)
Obstetric H&P   Chief Complaint: Leaking fluid   Prenatal Care Provider: [facility] (3 visits)  History of Present Illness: 28 y.o. [id number] 6255w4d by 02/17/2019, by 26 week Ultrasound presenting to L&D with SROM, clear.  She states she has been having some leakage since this morning.  Irregular contractions.  No VB, +FM.  Prenatal care notable for late entry to care, limited prenatal care.  She has had 4 term vaginal deliveries.  One baby passes away from SIDS at 5 months. Pelvis tested to 7lbs  Pregravid weight 63.5 kg Total Weight Gain 4.536 kg  Pregnancy # 5 Problems (from 11/04/18 to present)    Problem Noted Resolved   Limited prenatal care in third trimester 01/31/2019 by Vena AustriaStaebler, Doneta Bayman, MD No   Late prenatal care 11/12/2018 by Oswaldo ConroySchmid, Jacelyn Y, CNM No   Supervision of other normal pregnancy, antepartum 11/04/2018 by Tresea MallGledhill, Jane, CNM No   Overview Addendum 01/31/2019  4:19 PM by Vena AustriaStaebler, Kristal Perl, MD    Clinic [facility]  Dating 26w 1d Ultrasound Blood type: B/Positive/-- (04/27 1545)   Genetic Screen Declined Antibody:Negative (04/27 1545)  Anatomic US Complete and normal, female 11/12/2018 Rubella: 1.22 (04/27 1545) Varicella: Immune  GTT Early: NA Third trimester: 63 RPR: Non Reactive (04/27 1545)   Rhogam N/A HBsAg: Negative (04/27 1545)   TDaP vaccine  Flu Shot: declined HIV: Non Reactive (04/27 1545)   Baby Food Formula                            GBS: Unknown  Contraception  Pap: 1 or 2 years ago at [facility]- sign for records [x]   CBB     CS/VBAC NA   Support Person Friend Alda/contact person is her Steva ColderUncle Demond [phone] Late entry to care             Review of Systems: 10 point review of systems negative unless otherwise noted in HPI  Past Medical History: Past Medical History:  Diagnosis Date  . Medical history non-contributory     Past Surgical History: Past Surgical History:  Procedure Laterality Date  . NO PAST SURGERIES       Past Obstetric History: # 1 - Date: 2009, Sex: Female, Weight: None, GA: None, Delivery: Vaginal, Spontaneous, Apgar1: None, Apgar5: None, Living: Living, Birth Comments: None  # 2 - Date: 2012, Sex: Female, Weight: None, GA: None, Delivery: Vaginal, Spontaneous, Apgar1: None, Apgar5: None, Living: Deceased, Birth Comments: None  # 3 - Date: 482015, Sex: Female, Weight: None, GA: None, Delivery: Vaginal, Spontaneous, Apgar1: None, Apgar5: None, Living: Living, Birth Comments: None  # 4 - Date: 2016, Sex: Female, Weight: None, GA: None, Delivery: Vaginal, Spontaneous, Apgar1: None, Apgar5: None, Living: Living, Birth Comments: None  # 5 - Date: None, Sex: None, Weight: None, GA: None, Delivery: None, Apgar1: None, Apgar5: None, Living: None, Birth Comments: None   Past Gynecologic History:  Family History: Family History  Problem Relation Age of Onset  . Congestive Heart Failure Mother   . Diabetes Mother   . Diabetes Father     Social History: Social History   Socioeconomic History  . Marital status: Single    Spouse name: Not on file  . Number of children: Not on file  . Years of education: Not on file  . Highest education level: Not on file  Occupational History  . Not on file  Social Needs  . Financial resource strain:  Not hard at all  . Food insecurity    Worry: Never true    Inability: Never true  . Transportation needs    Medical: No    Non-medical: No  Tobacco Use  . Smoking status: Never Smoker  . Smokeless tobacco: Never Used  Substance and Sexual Activity  . Alcohol use: No  . Drug use: No  . Sexual activity: Yes    Comment: Nuvaring  Lifestyle  . Physical activity    Days per week: 2 days    Minutes per session: 20 min  . Stress: Not at all  Relationships  . Social Herbalist on phone: Twice a week    Gets together: Twice a week    Attends religious service: 1 to 4 times per year    Active member of club or organization: No    Attends  meetings of clubs or organizations: Never    Relationship status: Never married  . Intimate partner violence    Fear of current or ex partner: No    Emotionally abused: No    Physically abused: No    Forced sexual activity: No  Other Topics Concern  . Not on file  Social History Narrative  . Not on file    Medications: Prior to Admission medications   Medication Sig Start Date End Date Taking? Authorizing Provider  Prenat-FeFum-FePo-FA-Omega 3 (CONCEPT DHA) 53.5-38-1 MG CAPS Take 1 capsule by mouth daily. 11/12/18  Yes Rexene Agent, CNM  ferrous sulfate 325 (65 FE) MG tablet Take 1 tablet (325 mg total) by mouth daily with breakfast. Patient not taking: Reported on 01/31/2019 12/10/18   Rexene Agent, CNM    Allergies: No Known Allergies  Physical Exam: Vitals: Blood pressure 129/86, pulse 98, temperature 98.8 F (37.1 C), temperature source Oral, resp. rate 15, height 5\' 3"  (1.6 m), weight 68 kg.  FHT: 145, moderate, +accels, occasional variable/early Toco: q4-28min  General: NAD HEENT: normocephalic, anicteric Pulmonary: No increased work of breathing Cardiovascular: RRR, distal pulses 2+ Abdomen: Gravid, non-tender Leopolds:vtx ([place] confirmed) Genitourinary: 2/50/-2 Extremities: no edema, erythema, or tenderness Neurologic: Grossly intact Psychiatric: mood appropriate, affect full  Labs: No results found for this or any previous visit (from the past 24 hour(s)).  Assessment: 28 y.o. L2G4010 [redacted]w[redacted]d by 02/17/2019, by 26 week Ultrasound presenting with SROM in term labor  Plan: 1) Labor - expectant management, if contractions space out or no cervical change noted we discussed pitocin to decrease time of rupture and chorioamnionitis  2) Fetus - cat I tracing  3) PNL - Blood type B/Positive/-- (04/27 1545) / Anti-bodyscreen Negative (04/27 1545) / Rubella 1.22 (04/27 1545) / Varicella Immune / RPR Non Reactive (04/27 1545) / HBsAg Negative (04/27 1545) / HIV Non  Reactive (04/27 1545) / 1-hr OGTT 63 / GBS Unknown  4) Immunization History -   There is no immunization history on file for this patient.  5) Disposition - pending delivery  Malachy Mood, MD, [place], [facility] 01/31/2019, 5:07 PM

## 2019-02-01 ENCOUNTER — Inpatient Hospital Stay: Payer: Medicaid Other | Admitting: Anesthesiology

## 2019-02-01 LAB — CBC
HCT: 33.8 % — ABNORMAL LOW (ref 36.0–46.0)
Hemoglobin: 10.3 g/dL — ABNORMAL LOW (ref 12.0–15.0)
MCH: 24.3 pg — ABNORMAL LOW (ref 26.0–34.0)
MCHC: 30.5 g/dL (ref 30.0–36.0)
MCV: 79.9 fL — ABNORMAL LOW (ref 80.0–100.0)
Platelets: 132 10*3/uL — ABNORMAL LOW (ref 150–400)
RBC: 4.23 MIL/uL (ref 3.87–5.11)
RDW: 12.5 % (ref 11.5–15.5)
WBC: 26 10*3/uL — ABNORMAL HIGH (ref 4.0–10.5)
nRBC: 0 % (ref 0.0–0.2)

## 2019-02-01 LAB — CULTURE, BETA STREP (GROUP B ONLY)

## 2019-02-01 MED ORDER — EPHEDRINE 5 MG/ML INJ: 10.0000 mg | INTRAVENOUS | Status: DC | PRN

## 2019-02-01 MED ORDER — LACTATED RINGERS IV SOLN: 500.0000 mL | Freq: Once | INTRAVENOUS | Status: DC

## 2019-02-01 MED ORDER — WITCH HAZEL-GLYCERIN EX PADS: 1.0000 "application " | MEDICATED_PAD | CUTANEOUS | Status: DC | PRN

## 2019-02-01 MED ORDER — ACETAMINOPHEN 325 MG PO TABS
ORAL_TABLET | ORAL | Status: AC
  Administered 2019-02-01: 650 mg via ORAL
  Filled 2019-02-01: qty 2

## 2019-02-01 MED ORDER — PRENATAL MULTIVITAMIN CH
1.0000 | ORAL_TABLET | Freq: Every day | ORAL | Status: DC
  Administered 2019-02-01: 1 via ORAL
  Filled 2019-02-01: qty 1

## 2019-02-01 MED ORDER — BENZOCAINE-MENTHOL 20-0.5 % EX AERO: 1.0000 "application " | INHALATION_SPRAY | CUTANEOUS | Status: DC | PRN

## 2019-02-01 MED ORDER — PHENYLEPHRINE 40 MCG/ML (10ML) SYRINGE FOR IV PUSH (FOR BLOOD PRESSURE SUPPORT): 80.0000 ug | PREFILLED_SYRINGE | INTRAVENOUS | Status: DC | PRN

## 2019-02-01 MED ORDER — OXYCODONE-ACETAMINOPHEN 5-325 MG PO TABS
2.0000 | ORAL_TABLET | ORAL | Status: DC | PRN
  Filled 2019-02-01: qty 2

## 2019-02-01 MED ORDER — FENTANYL 2.5 MCG/ML W/ROPIVACAINE 0.15% IN NS 100 ML EPIDURAL (ARMC)
EPIDURAL | Status: AC
  Filled 2019-02-01: qty 100

## 2019-02-01 MED ORDER — IBUPROFEN 600 MG PO TABS
600.0000 mg | ORAL_TABLET | Freq: Four times a day (QID) | ORAL | Status: DC
  Administered 2019-02-01 – 2019-02-02 (×5): 600 mg via ORAL
  Filled 2019-02-01 (×5): qty 1

## 2019-02-01 MED ORDER — FENTANYL 2.5 MCG/ML W/ROPIVACAINE 0.15% IN NS 100 ML EPIDURAL (ARMC): 12.0000 mL/h | EPIDURAL | Status: DC

## 2019-02-01 MED ORDER — SENNOSIDES-DOCUSATE SODIUM 8.6-50 MG PO TABS
2.0000 | ORAL_TABLET | ORAL | Status: DC
  Filled 2019-02-01: qty 2

## 2019-02-01 MED ORDER — DIBUCAINE (PERIANAL) 1 % EX OINT: 1.0000 "application " | TOPICAL_OINTMENT | CUTANEOUS | Status: DC | PRN

## 2019-02-01 MED ORDER — ONDANSETRON HCL 4 MG/2ML IJ SOLN: 4.0000 mg | INTRAMUSCULAR | Status: DC | PRN

## 2019-02-01 MED ORDER — DIPHENHYDRAMINE HCL 25 MG PO CAPS: 25.0000 mg | ORAL_CAPSULE | Freq: Four times a day (QID) | ORAL | Status: DC | PRN

## 2019-02-01 MED ORDER — FENTANYL 2.5 MCG/ML W/ROPIVACAINE 0.15% IN NS 100 ML EPIDURAL (ARMC)
EPIDURAL | Status: DC | PRN
  Administered 2019-02-01: 12 mL/h via EPIDURAL

## 2019-02-01 MED ORDER — COCONUT OIL OIL: 1.0000 "application " | TOPICAL_OIL | Status: DC | PRN

## 2019-02-01 MED ORDER — LIDOCAINE-EPINEPHRINE (PF) 1.5 %-1:200000 IJ SOLN
INTRAMUSCULAR | Status: DC | PRN
  Administered 2019-02-01: 3 mL via EPIDURAL

## 2019-02-01 MED ORDER — ACETAMINOPHEN 325 MG PO TABS
650.0000 mg | ORAL_TABLET | ORAL | Status: DC | PRN
  Administered 2019-02-01 (×3): 650 mg via ORAL
  Filled 2019-02-01 (×2): qty 2

## 2019-02-01 MED ORDER — SIMETHICONE 80 MG PO CHEW: 80.0000 mg | CHEWABLE_TABLET | ORAL | Status: DC | PRN

## 2019-02-01 MED ORDER — ONDANSETRON HCL 4 MG PO TABS: 4.0000 mg | ORAL_TABLET | ORAL | Status: DC | PRN

## 2019-02-01 MED ORDER — DIPHENHYDRAMINE HCL 50 MG/ML IJ SOLN: 12.5000 mg | INTRAMUSCULAR | Status: DC | PRN

## 2019-02-01 MED ORDER — TETANUS-DIPHTH-ACELL PERTUSSIS 5-2.5-18.5 LF-MCG/0.5 IM SUSP: 0.5000 mL | Freq: Once | INTRAMUSCULAR | Status: DC

## 2019-02-01 MED ORDER — OXYCODONE-ACETAMINOPHEN 5-325 MG PO TABS: 1.0000 | ORAL_TABLET | ORAL | Status: DC | PRN

## 2019-02-01 MED ORDER — OXYCODONE HCL 5 MG PO TABS
5.0000 mg | ORAL_TABLET | ORAL | Status: DC | PRN
  Administered 2019-02-01: 5 mg via ORAL
  Administered 2019-02-01: 10 mg via ORAL
  Administered 2019-02-01 (×2): 5 mg via ORAL
  Administered 2019-02-02 (×2): 10 mg via ORAL
  Filled 2019-02-01 (×3): qty 2
  Filled 2019-02-01 (×3): qty 1

## 2019-02-01 NOTE — Plan of Care (Signed)
Patient's vital signs stable; fundus firm; small amount rubra lochia; good appetite; good po fluids; voiding; patient states she had a bowel movement today; patient ambulates in hall to go visit/bond with infant in SCN; patient's support person at bedside since about 2035.

## 2019-02-01 NOTE — Anesthesia Procedure Notes (Signed)
Epidural Patient location during procedure: OB Start time: 02/01/2019 2:17 AM End time: 02/01/2019 2:34 AM  Staffing Performed: anesthesiologist   Preanesthetic Checklist Completed: patient identified, site marked, surgical consent, pre-op evaluation, timeout performed, IV checked, risks and benefits discussed and monitors and equipment checked  Epidural Patient position: sitting Prep: Betadine Patient monitoring: heart rate, continuous pulse ox and blood pressure Approach: midline Location: L4-L5 Injection technique: LOR saline  Needle:  Needle type: Tuohy  Needle gauge: 17 G Needle length: 9 cm and 9 Needle insertion depth: 7 cm Catheter type: closed end flexible Catheter size: 20 Guage Catheter at skin depth: 10 cm Test dose: negative and 1.5% lidocaine with Epi 1:200 K  Assessment Events: blood not aspirated, injection not painful, no injection resistance, negative IV test and no paresthesia  Additional Notes   Patient tolerated the insertion well without complications.Reason for block:procedure for pain

## 2019-02-01 NOTE — Anesthesia Preprocedure Evaluation (Signed)
Anesthesia Evaluation  Patient identified by MRN, date of birth, ID band Patient awake    Reviewed: Allergy & Precautions, NPO status , Patient's Chart, lab work & pertinent test results  History of Anesthesia Complications Negative for: history of anesthetic complications  Airway Mallampati: II       Dental   Pulmonary neg sleep apnea, neg COPD,           Cardiovascular (-) hypertension(-) Past MI and (-) CHF (-) dysrhythmias (-) Valvular Problems/Murmurs     Neuro/Psych neg Seizures    GI/Hepatic neg GERD  ,(+)     substance abuse (amphetamines)  ,   Endo/Other  neg diabetes  Renal/GU negative Renal ROS     Musculoskeletal  (+) narcotic dependent  Abdominal   Peds  Hematology   Anesthesia Other Findings   Reproductive/Obstetrics (+) Pregnancy                             Anesthesia Physical Anesthesia Plan  ASA: II  Anesthesia Plan: Epidural   Post-op Pain Management:    Induction:   PONV Risk Score and Plan:   Airway Management Planned:   Additional Equipment:   Intra-op Plan:   Post-operative Plan:   Informed Consent: I have reviewed the patients History and Physical, chart, labs and discussed the procedure including the risks, benefits and alternatives for the proposed anesthesia with the patient or authorized representative who has indicated his/her understanding and acceptance.       Plan Discussed with:   Anesthesia Plan Comments:         Anesthesia Quick Evaluation

## 2019-02-01 NOTE — Progress Notes (Signed)
Subjective:  Minimal lochia, coping well with NICU.  However we discussed signs and symptoms of depression today given history of 245 month old that died of SIDS and baby in NICU.  Objective:  Vital signs in last 24 hours: Temp:  [97.6 F (36.4 C)-98.8 F (37.1 C)] 98 F (36.7 C) (06/28 0909) Pulse Rate:  [80-152] 82 (06/28 0909) Resp:  [15-20] 16 (06/28 0909) BP: (115-149)/(73-109) 115/73 (06/28 0909) SpO2:  [99 %-100 %] 100 % (06/28 0909) Weight:  [68 kg] 68 kg (06/27 1636)    General: NAD Pulmonary: no increased work of breathing Abdomen: non-distended, non-tender, fundus firm at level of umbilicus Extremities: no edema, no erythema, no tenderness  Results for orders placed or performed during the hospital encounter of 01/31/19 (from the past 72 hour(s))  OB RESULTS CONSOLE GC/Chlamydia     Status: None   Collection Time: 01/31/19 12:00 AM  Result Value Ref Range   Chlamydia Negative   Urine Drug Screen, Qualitative ([facility] only)     Status: Abnormal   Collection Time: 01/31/19  4:45 PM  Result Value Ref Range   Tricyclic, Ur Screen NONE DETECTED NONE DETECTED   Amphetamines, Ur Screen POSITIVE (A) NONE DETECTED   MDMA (Ecstasy)Ur Screen NONE DETECTED NONE DETECTED   Cocaine Metabolite,Ur [place] NONE DETECTED NONE DETECTED   Opiate, Ur Screen POSITIVE (A) NONE DETECTED   Phencyclidine (PCP) Ur S NONE DETECTED NONE DETECTED   Cannabinoid 50 Ng, Ur [place] NONE DETECTED NONE DETECTED   Barbiturates, Ur Screen NONE DETECTED NONE DETECTED   Benzodiazepine, Ur Scrn NONE DETECTED NONE DETECTED   Methadone Scn, Ur NONE DETECTED NONE DETECTED    Comment: (NOTE) Tricyclics + metabolites, urine    Cutoff 1000 ng/mL Amphetamines + metabolites, urine  Cutoff 1000 ng/mL MDMA (Ecstasy), urine              Cutoff 500 ng/mL Cocaine Metabolite, urine          Cutoff 300 ng/mL Opiate + metabolites, urine        Cutoff 300 ng/mL Phencyclidine (PCP), urine         Cutoff 25 ng/mL Cannabinoid,  urine                 Cutoff 50 ng/mL Barbiturates + metabolites, urine  Cutoff 200 ng/mL Benzodiazepine, urine              Cutoff 200 ng/mL Methadone, urine                   Cutoff 300 ng/mL The urine drug screen provides only a preliminary, unconfirmed analytical test result and should not be used for non-medical purposes. Clinical consideration and professional judgment should be applied to any positive drug screen result due to possible interfering substances. A more specific alternate chemical method must be used in order to obtain a confirmed analytical result. Gas chromatography / mass spectrometry (GC/MS) is the preferred confirmat ory method. Performed at [facility], [address] [place], [place] [address]   Culture, beta strep (group b only)     Status: None (Preliminary result)   Collection Time: 01/31/19  4:45 PM   Specimen: Vaginal/Rectal; Genital  Result Value Ref Range   Specimen Description      VAGINAL/RECTAL Performed at [facility], [address] [place], [place] [address]    Special Requests      NONE Performed at [facility], [address] [place], [place] [address]  Culture      CULTURE REINCUBATED FOR BETTER GROWTH Performed at [facility], Ali Molina [address] [place], [place] [address]    Report Status PENDING   Chlamydia/NGC rt PCR ([facility] only)     Status: None   Collection Time: 01/31/19  4:45 PM   Specimen: Vaginal/Rectal  Result Value Ref Range   Specimen source GC/Chlam URINE, RANDOM    Chlamydia Tr NOT DETECTED NOT DETECTED   N gonorrhoeae NOT DETECTED NOT DETECTED    Comment: (NOTE) This CT/NG assay has not been evaluated in patients with a history of  hysterectomy. Performed at [facility], [address], [place], [place] [address]   SARS Coronavirus 2 (CEPHEID - Performed in [facility] hospital lab), Hosp Order     Status: None   Collection Time: 01/31/19  5:15 PM   Specimen:  Nasopharyngeal Swab  Result Value Ref Range   SARS Coronavirus 2 NEGATIVE NEGATIVE    Comment: (NOTE) If result is NEGATIVE SARS-CoV-2 target nucleic acids are NOT DETECTED. The SARS-CoV-2 RNA is generally detectable in upper and lower  respiratory specimens during the acute phase of infection. The lowest  concentration of SARS-CoV-2 viral copies this assay can detect is 250  copies / mL. A negative result does not preclude SARS-CoV-2 infection  and should not be used as the sole basis for treatment or other  patient management decisions.  A negative result may occur with  improper specimen collection / handling, submission of specimen other  than nasopharyngeal swab, presence of viral mutation(s) within the  areas targeted by this assay, and inadequate number of viral copies  (<250 copies / mL). A negative result must be combined with clinical  observations, patient history, and epidemiological information. If result is POSITIVE SARS-CoV-2 target nucleic acids are DETECTED. The SARS-CoV-2 RNA is generally detectable in upper and lower  respiratory specimens dur ing the acute phase of infection.  Positive  results are indicative of active infection with SARS-CoV-2.  Clinical  correlation with patient history and other diagnostic information is  necessary to determine patient infection status.  Positive results do  not rule out bacterial infection or co-infection with other viruses. If result is PRESUMPTIVE POSTIVE SARS-CoV-2 nucleic acids MAY BE PRESENT.   A presumptive positive result was obtained on the submitted specimen  and confirmed on repeat testing.  While 2019 novel coronavirus  (SARS-CoV-2) nucleic acids may be present in the submitted sample  additional confirmatory testing may be necessary for epidemiological  and / or clinical management purposes  to differentiate between  SARS-CoV-2 and other Sarbecovirus currently known to infect humans.  If clinically indicated  additional testing with an alternate test  methodology [phone]) is advised. The SARS-CoV-2 RNA is generally  detectable in upper and lower respiratory sp ecimens during the acute  phase of infection. The expected result is Negative. Fact Sheet for Patients:  StrictlyIdeas.no Fact Sheet for Healthcare Providers: BankingDealers.co.za This test is not yet approved or cleared by the [place] FDA and has been authorized for detection and/or diagnosis of SARS-CoV-2 by FDA under an Emergency Use Authorization (EUA).  This EUA will remain in effect (meaning this test can be used) for the duration of the COVID-19 declaration under Section 564(b)(1) of the Act, 21 U.S.C. section 360bbb-3(b)(1), unless the authorization is terminated or revoked sooner. Performed at [facility], [address] [place], [place] [address]   CBC     Status: Abnormal   Collection Time: 01/31/19  5:34 PM  Result Value Ref Range   WBC 16.2 (H) 4.0 - 10.5 K/uL   RBC 4.54 3.87 - 5.11 MIL/uL   Hemoglobin 11.1 (L) 12.0 - 15.0 g/dL   HCT 54.035.9 (L) 98.136.0 - 19.146.0 %   MCV 79.1 (L) 80.0 - 100.0 fL   MCH 24.4 (L) 26.0 - 34.0 pg   MCHC 30.9 30.0 - 36.0 g/dL   RDW 47.812.7 29.511.5 - 62.115.5 %   Platelets 188 150 - 400 K/uL   nRBC 0.0 0.0 - 0.2 %    Comment: Performed at [facility], [address] [place], [place] [address]  Type and screen     Status: None   Collection Time: 01/31/19  6:52 PM  Result Value Ref Range   ABO/RH(D) B POS    Antibody Screen NEG    Sample Expiration      02/03/2019,2359 Performed at [facility], [address] [place], [place] [address]   CBC     Status: Abnormal   Collection Time: 02/01/19  7:22 AM  Result Value Ref Range   WBC 26.0 (H) 4.0 - 10.5 K/uL   RBC 4.23 3.87 - 5.11 MIL/uL   Hemoglobin 10.3 (L) 12.0 - 15.0 g/dL   HCT 62.933.8 (L) 52.836.0 - 41.346.0 %   MCV 79.9 (L) 80.0 - 100.0 fL   MCH 24.3 (L) 26.0 - 34.0 pg    MCHC 30.5 30.0 - 36.0 g/dL   RDW 24.412.5 01.011.5 - 27.215.5 %   Platelets 132 (L) 150 - 400 K/uL   nRBC 0.0 0.0 - 0.2 %    Comment: Performed at [facility], [address] [place], [place] [address]    Assessment:   28 y.o. [id number] postpartum day # 1 TSVD  Plan:    1) Acute blood loss anemia - hemodynamically stable and asymptomatic - po ferrous sulfate  2) Blood Type --/--/B POS (06/27 1852) / Rubella 1.22 (04/27 1545) / Varicella Immune  3) TDAP status administer prior to discharge  4) Feeding plan bottle  5)  Education given regarding options for contraception, as well as compatibility with breast feeding if applicable.  Patient plans on NuvaRing vaginal inserts for contraception.  6) Disposition - anticipated PPD1 to 2 depending on infants progress  Vena AustriaAndreas Doll Frazee, MD, Merlinda FrederickFACOG [facility], [facility] 02/01/2019, 10:52 AM

## 2019-02-01 NOTE — Plan of Care (Signed)
Pt. Oriented to room. Fall prevention explained to Pt. And she v/o.

## 2019-02-01 NOTE — Progress Notes (Signed)
Patient returned to her room 346 at 2030. Ailene Ards RN, [facility] was notified  by me and she notified Lenda Kelp RN, [facility]. Lenda Kelp RN, [facility] will come as soon as she can to talk with patient.

## 2019-02-01 NOTE — Discharge Summary (Signed)
Obstetric Discharge Summary Reason for Admission: rupture of membranes Prenatal Procedures: none Intrapartum Procedures: spontaneous vaginal delivery Postpartum Procedures: none Complications-Operative and Postpartum: none Hemoglobin  Date Value Ref Range Status  02/02/2019 9.5 (L) 12.0 - 15.0 g/dL Final  12/01/2018 10.0 (L) 11.1 - 15.9 g/dL Final   HCT  Date Value Ref Range Status  02/02/2019 30.1 (L) 36.0 - 46.0 % Final   Hematocrit  Date Value Ref Range Status  12/01/2018 31.2 (L) 34.0 - 46.6 % Final    Physical Exam:  General: alert, cooperative, appears stated age and no distress Lochia: appropriate Uterine Fundus: firm Incision: NA DVT Evaluation: No evidence of DVT seen on physical exam.  Discharge Diagnoses: Term Pregnancy-delivered  Discharge Information: Date: 02/02/2019 Activity: pelvic rest Diet: routine Allergies as of 02/02/2019   No Known Allergies     Medication List    STOP taking these medications   ferrous sulfate 325 (65 FE) MG tablet     TAKE these medications   Concept DHA 53.5-38-1 MG Caps Take 1 capsule by mouth daily.   etonogestrel-ethinyl estradiol 0.12-0.015 MG/24HR vaginal ring Commonly known as: NUVARING Insert vaginally and leave in place for 3 consecutive weeks, then remove for 1 week. Start taking on: February 15, 2019       Condition: stable Discharge to: home Follow-up Information    Malachy Mood, MD Follow up in 6 week(s).   Specialty: Obstetrics and Gynecology Why: postpartum visit Contact information: [place] [address] [phone]        [facility] Follow up in 1 week(s).   Why: postpartum depression check Contact information: [place] [address] [phone]           Newborn Data: Live born child  Birth Weight:  2630 APGAR: 68, 8  Newborn Delivery   Birth date/time: 02/01/2019 03:29:00 Delivery type: Vaginal, Spontaneous      Newborn: stay in special care nursery   Rod Can, [place] 02/02/2019, 8:51 AM

## 2019-02-01 NOTE — Progress Notes (Addendum)
At [place] (myself) smelled cigarette smoke in patient's room and strongly in patient's bathroom (room 346). When patient was asked about it, patient denied smoking in her room or her bathroom (room 346). After patient went to visit her infant in SCN, Elinor Dodge RN and Ailene Ards RN [facility]), (and myself) went in patient's room 346 and to smell and they also verified the same strong cigarette smoke scent. Also, a towel was observed in the floor covering the inside base of the bathroom door. Ailene Ards RN [facility]) called Lenda Kelp RN [facility]) to notify her as well. Lenda Kelp RN [facility]) wants to be notified when the patient returns to her room (346).

## 2019-02-01 NOTE — Progress Notes (Signed)
Late Entry- At approximately 10;30am, patient requested to go outside.  Purcell Nails, [facility] contacted and states that patients are not able to leave floor at this time due to concerns of Covid exposure.  Pt given this information, and offered nicotine replacement.  Pt states that she does not smoke and that she only wants to walk to cafeteria because she needs to get a snack and out of her room.  Offered to walk with patient to hallway windows, but patient declines.  Patient reminded that she can call for snacks from RN or from cafeteria.  Pt again states that she wants to walk outside or to the cafeteria.  Education for infection concerns reinforced.  Pt verbalizes understanding and agreement with request to stay on 3rd floor. Reed Breech, RN 02/01/2019 8:12 PM

## 2019-02-02 LAB — CBC
HCT: 30.1 % — ABNORMAL LOW (ref 36.0–46.0)
Hemoglobin: 9.5 g/dL — ABNORMAL LOW (ref 12.0–15.0)
MCH: 24.9 pg — ABNORMAL LOW (ref 26.0–34.0)
MCHC: 31.6 g/dL (ref 30.0–36.0)
MCV: 79 fL — ABNORMAL LOW (ref 80.0–100.0)
Platelets: 165 10*3/uL (ref 150–400)
RBC: 3.81 MIL/uL — ABNORMAL LOW (ref 3.87–5.11)
RDW: 12.7 % (ref 11.5–15.5)
WBC: 22.4 10*3/uL — ABNORMAL HIGH (ref 4.0–10.5)
nRBC: 0 % (ref 0.0–0.2)

## 2019-02-02 LAB — RPR: RPR Ser Ql: NONREACTIVE

## 2019-02-02 MED ORDER — ETONOGESTREL-ETHINYL ESTRADIOL 0.12-0.015 MG/24HR VA RING: VAGINAL_RING | VAGINAL | 4 refills | Status: DC

## 2019-02-02 NOTE — Clinical Social Work Maternal (Signed)
RNCM to patient's room for assessment; order placed for consult due to positive opioids and amphetamines.  Infant's UDS is negative.  Infant is currently in the NICU requiring oxygen.  As entering the room patient asked if MD will be discharging her with pain medicine.  This RNCM explained they will discharge her with ibuprofen/tylenol; she can use heating pad as needed.  She is in agreement with that.  Patient states she lives alone with her 3 children ages 46, 81 and 53.  She had a baby pass from SIDS at 74 months old.  She has full custody of her children.  Two of her children have the same FOB and the others do not.  This FOB is not involved; asked for FOB's name and patient refused to disclose.  She states she knows who FOB is but he will have nothing to do with the baby.  She is able to meet basic needs; house is prepared to bring infant home.  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  Her support person is baby's God mother.  She has several extended family members close by for support as well.  Patient is planning on bottle feeding.  This RNCM questioned patient about her UDS.  She states she was prescribed Perocet from [place] for a tooth ache; she states she still has 3-4 left.  Cannot remember how many she was originally prescribed.  She states she does not know how she is positive for amphetamines.  She states she has never used meth or Adderall type medications.  She denies smoking or drinking alcohol.   The plan is for patient to discharge today; uncle will transport her home.  Infant should discharge tomorrow per patient.  Will make CPS report as patient's UDS was positive and there are other children in the home.

## 2019-02-02 NOTE — Progress Notes (Signed)
Pt discharged. Infant in SCN. Discharge instructions, prescriptions and follow up appointment given to and reviewed with pt. Pt verbalized understanding. Escorted out by Staff.

## 2019-02-04 NOTE — Anesthesia Postprocedure Evaluation (Signed)
Anesthesia Post Note  Patient: Nicole Kaufman  Procedure(s) Performed: AN AD HOC LABOR EPIDURAL  Anesthesia Type: Epidural Comments: Patient discharged prior to anesthesia evaluation     Last Vitals:  Vitals:   02/01/19 2339 02/02/19 0734  BP: 119/81 111/74  Pulse: 78 74  Resp: 18 18  Temp: 36.8 C 36.7 C  SpO2: 100% 100%    Last Pain:  Vitals:   02/02/19 1007  TempSrc:   PainSc: 7                  Doreen Salvage A

## 2019-02-09 ENCOUNTER — Other Ambulatory Visit: Payer: Self-pay

## 2019-02-09 ENCOUNTER — Ambulatory Visit (INDEPENDENT_AMBULATORY_CARE_PROVIDER_SITE_OTHER): Payer: Medicaid Other | Admitting: Obstetrics and Gynecology

## 2019-02-09 ENCOUNTER — Encounter: Payer: Self-pay | Admitting: Obstetrics and Gynecology

## 2019-02-09 DIAGNOSIS — Z1389 Encounter for screening for other disorder: Secondary | ICD-10-CM | POA: Diagnosis not present

## 2019-02-09 NOTE — Progress Notes (Signed)
  OBSTETRICS POSTPARTUM CLINIC PROGRESS NOTE  Subjective:     Nicole Kaufman is a 28 y.o. [id number] female who presents for a postpartum visit. She is 1 week postpartum following a Term pregnancy and delivery by Vaginal, no problems at delivery.  I have fully reviewed the prenatal and intrapartum course. Anesthesia: epidural.  Postpartum course has been complicated by uncomplicated.  Baby is feeding by Bottle.  Bleeding: patient has not  resumed menses.  Bowel function is normal. Bladder function is normal.  Patient is not sexually active. Contraception method desired is NuvaRing vaginal inserts.  Postpartum depression screening: negative. Edinburgh 0.  The following portions of the patient's history were reviewed and updated as appropriate: allergies, current medications, past family history, past medical history, past social history, past surgical history and problem list.  Review of Systems Pertinent items are noted in HPI.  Objective:    BP 130/90   Ht 5\' 3"  (1.6 m)   Wt 134 lb 3.2 oz (60.9 kg)   Breastfeeding No   BMI 23.77 kg/m   General:  alert and no distress   Breasts:  inspection negative, no nipple discharge or bleeding, no masses or nodularity palpable  Lungs: clear to auscultation bilaterally  Heart:  regular rate and rhythm, S1, S2 normal, no murmur, click, rub or gallop  Abdomen: soft, non-tender; bowel sounds normal; no masses,  no organomegaly.                   Rectal Exam: Not performed.          Assessment:  Post Partum Care visit There are no diagnoses linked to this encounter.  Plan:  See orders and Patient Instructions Follow up in: 5 weeks or as needed.   Needs postpartum pap  Adrian Prows MD [place], [facility] 02/09/2019 11:41 AM

## 2019-02-16 ENCOUNTER — Telehealth: Payer: Self-pay

## 2019-02-16 NOTE — Telephone Encounter (Signed)
Pt calling; del 6/28; needs something a little stronger than ibuprofen; states wasn't given anything in hosp.  [phone]

## 2019-02-17 NOTE — Telephone Encounter (Signed)
Address.

## 2019-02-17 NOTE — Telephone Encounter (Signed)
She had a positive urine drug screen at delivery and only came to two prenatal visits so 2 weeks postpartum ibuprofen is all she should need.  No narcotics.

## 2019-02-17 NOTE — Telephone Encounter (Signed)
Please have him advise in AMS absence. Thank you

## 2019-02-23 MED ORDER — ETONOGESTREL-ETHINYL ESTRADIOL 0.12-0.015 MG/24HR VA RING: VAGINAL_RING | VAGINAL | 4 refills | Status: DC

## 2019-03-03 ENCOUNTER — Other Ambulatory Visit: Payer: Self-pay

## 2019-03-03 ENCOUNTER — Encounter: Payer: Self-pay | Admitting: Obstetrics and Gynecology

## 2019-03-03 ENCOUNTER — Ambulatory Visit (INDEPENDENT_AMBULATORY_CARE_PROVIDER_SITE_OTHER): Payer: Medicaid Other | Admitting: Obstetrics and Gynecology

## 2019-03-03 VITALS — BP 140/90 | Ht 63.0 in | Wt 137.8 lb

## 2019-03-03 DIAGNOSIS — Z30011 Encounter for initial prescription of contraceptive pills: Secondary | ICD-10-CM

## 2019-03-03 DIAGNOSIS — N760 Acute vaginitis: Secondary | ICD-10-CM | POA: Diagnosis not present

## 2019-03-03 DIAGNOSIS — B9689 Other specified bacterial agents as the cause of diseases classified elsewhere: Secondary | ICD-10-CM

## 2019-03-03 LAB — POCT WET PREP WITH KOH
Clue Cells Wet Prep HPF POC: POSITIVE
KOH Prep POC: POSITIVE — AB
Trichomonas, UA: NEGATIVE
Yeast Wet Prep HPF POC: NEGATIVE

## 2019-03-03 MED ORDER — METRONIDAZOLE 500 MG PO TABS: 500.0000 mg | ORAL_TABLET | Freq: Two times a day (BID) | ORAL | 0 refills | Status: AC

## 2019-03-03 MED ORDER — LEVONORGESTREL-ETHINYL ESTRAD 0.1-20 MG-MCG PO TABS: 1.0000 | ORAL_TABLET | Freq: Every day | ORAL | 0 refills | Status: DC

## 2019-03-03 NOTE — Patient Instructions (Signed)
I value your feedback and entrusting us with your care. If you get a [facility] patient survey, I would appreciate you taking the time to let us know about your experience today. Thank you! 

## 2019-03-03 NOTE — Progress Notes (Signed)
System, Provider Not In   Chief Complaint  Patient presents with  . Vaginal Discharge    odor, itchiness, no irritation since yesterday  . Contraception    medicaid is not paying for vaginal ring, would like new BC that medicaid covers    HPI:      Ms. Nicole Kaufman is a 28 y.o. [id number] who LMP was No LMP recorded., presents today for increased d/c with odor, no irritation since yesterday. No urin sx, LBP, pelvic pain, fevers. S/p SVD 02/01/19. Bottle feeding. Pt is not sex active. Just stopped bleeding from delivery yesterday.   Wanted to restart nuvaring PP but too expensive as self-pay. Has pregnancy MCD currently. Would like to do OCPs. Had nexplanon in past with irregular bleeding and didn't like depo.  Has PP appt 03/15/28.   Past Medical History:  Diagnosis Date  . Medical history non-contributory     Past Surgical History:  Procedure Laterality Date  . NO PAST SURGERIES      Family History  Problem Relation Age of Onset  . Congestive Heart Failure Mother   . Diabetes Mother   . Diabetes Father     Social History   Socioeconomic History  . Marital status: Single    Spouse name: Not on file  . Number of children: Not on file  . Years of education: Not on file  . Highest education level: Not on file  Occupational History  . Not on file  Social Needs  . Financial resource strain: Not hard at all  . Food insecurity    Worry: Never true    Inability: Never true  . Transportation needs    Medical: No    Non-medical: No  Tobacco Use  . Smoking status: Never Smoker  . Smokeless tobacco: Never Used  Substance and Sexual Activity  . Alcohol use: No  . Drug use: No  . Sexual activity: Not Currently    Birth control/protection: None  Lifestyle  . Physical activity    Days per week: 2 days    Minutes per session: 20 min  . Stress: Not at all  Relationships  . Social Herbalist on phone: Twice a week    Gets together: Twice a week   Attends religious service: 1 to 4 times per year    Active member of club or organization: No    Attends meetings of clubs or organizations: Never    Relationship status: Never married  . Intimate partner violence    Fear of current or ex partner: No    Emotionally abused: No    Physically abused: No    Forced sexual activity: No  Other Topics Concern  . Not on file  Social History Narrative  . Not on file    Outpatient Medications Prior to Visit  Medication Sig Dispense Refill  . Prenat-FeFum-FePo-FA-Omega 3 (CONCEPT DHA) 53.5-38-1 MG CAPS Take 1 capsule by mouth daily. 30 capsule 4   No facility-administered medications prior to visit.       ROS:  Review of Systems  Constitutional: Negative for fever.  Gastrointestinal: Negative for blood in stool, constipation, diarrhea, nausea and vomiting.  Genitourinary: Positive for vaginal discharge. Negative for dyspareunia, dysuria, flank pain, frequency, hematuria, urgency, vaginal bleeding and vaginal pain.  Musculoskeletal: Negative for back pain.  Skin: Negative for rash.    OBJECTIVE:   Vitals:  BP 140/90   Ht 5\' 3"  (1.6 m)   Wt 137 lb  12.8 oz (62.5 kg)   Breastfeeding No   BMI 24.41 kg/m   Physical Exam Vitals signs reviewed.  Constitutional:      Appearance: She is well-developed.  Neck:     Musculoskeletal: Normal range of motion.  Pulmonary:     Effort: Pulmonary effort is normal.  Genitourinary:    General: Normal vulva.     Pubic Area: No rash.      Labia:        Right: No rash, tenderness or lesion.        Left: No rash, tenderness or lesion.      Vagina: Normal. No vaginal discharge, erythema or tenderness.     Cervix: Normal.     Uterus: Normal. Not enlarged and not tender.      Adnexa: Right adnexa normal and left adnexa normal.       Right: No mass or tenderness.         Left: No mass or tenderness.    Musculoskeletal: Normal range of motion.  Skin:    General: Skin is warm and dry.   Neurological:     General: No focal deficit present.     Mental Status: She is alert and oriented to person, place, and time.  Psychiatric:        Mood and Affect: Mood normal.        Behavior: Behavior normal.        Thought Content: Thought content normal.        Judgment: Judgment normal.     Results: Results for orders placed or performed in visit on 03/03/19 (from the past 24 hour(s))  POCT Wet Prep with KOH     Status: Abnormal   Collection Time: 03/03/19  4:08 PM  Result Value Ref Range   Trichomonas, UA Negative    Clue Cells Wet Prep HPF POC pos    Epithelial Wet Prep HPF POC     Yeast Wet Prep HPF POC neg    Bacteria Wet Prep HPF POC     RBC Wet Prep HPF POC     WBC Wet Prep HPF POC     KOH Prep POC Positive (A) Negative     Assessment/Plan: Bacterial vaginosis - Plan: POCT Wet Prep with KOH, metroNIDAZOLE (FLAGYL) 500 MG tablet, Pos sx/wet prep. Rx flagyl. No EtOH. Will RF if sx recur. F/u prn.   Encounter for initial prescription of contraceptive pills - Plan: levonorgestrel-ethinyl estradiol (AVIANE) 0.1-20 MG-MCG tablet, OCP start today. Pt to use [email or web address] coupon (texted to her). [facility]   Meds ordered this encounter  Medications  . metroNIDAZOLE (FLAGYL) 500 MG tablet    Sig: Take 1 tablet (500 mg total) by mouth 2 (two) times daily for 7 days.    Dispense:  14 tablet    Refill:  0    Order Specific Question:   Supervising Provider    Answer:   Nadara MustardHARRIS, ROBERT P [id number]]  . levonorgestrel-ethinyl estradiol (AVIANE) 0.1-20 MG-MCG tablet    Sig: Take 1 tablet by mouth daily.    Dispense:  28 tablet    Refill:  0    Order Specific Question:   Supervising Provider    Answer:   Nadara MustardHARRIS, ROBERT P [[id number]]      Return if symptoms worsen or fail to improve.  Pebbles Zeiders B. Alfonso Carden, PA-C 03/03/2019 4:09 PM

## 2019-03-16 ENCOUNTER — Ambulatory Visit: Payer: Medicaid Other | Admitting: Obstetrics and Gynecology

## 2019-03-28 ENCOUNTER — Other Ambulatory Visit: Payer: Self-pay | Admitting: Obstetrics and Gynecology

## 2019-03-28 DIAGNOSIS — Z30011 Encounter for initial prescription of contraceptive pills: Secondary | ICD-10-CM

## 2019-03-31 NOTE — Telephone Encounter (Signed)
This encounter was created in error - please disregard.

## 2019-04-10 ENCOUNTER — Ambulatory Visit: Payer: Medicaid Other | Admitting: Obstetrics and Gynecology

## 2019-04-27 ENCOUNTER — Ambulatory Visit: Payer: Medicaid Other | Admitting: Obstetrics and Gynecology

## 2019-04-30 ENCOUNTER — Telehealth: Payer: Self-pay

## 2019-04-30 ENCOUNTER — Other Ambulatory Visit: Payer: Self-pay | Admitting: Obstetrics and Gynecology

## 2019-04-30 DIAGNOSIS — Z30011 Encounter for initial prescription of contraceptive pills: Secondary | ICD-10-CM

## 2019-04-30 NOTE — Telephone Encounter (Signed)
Pt has 6 week pp visit 10/13. Please advise if OCP can be called in

## 2019-04-30 NOTE — Telephone Encounter (Signed)
Patient is schedule for next available appointment with AMS on 03/20/19. Patient is reqesting refill to get to her appointment. Please advise

## 2019-04-30 NOTE — Telephone Encounter (Signed)
Please call pt to get her scheduled for an appt. She was never seen for her 6 week PP visit and requesting birth control. She is aware we cannot send anything in until she is seen by delivering provider. Please schedule and then send to delivering provider to see if they will send in OCP's for her. She may have to wait until she is seen. I think AMS was delivering provider. Thank you. She is aware you will be calling to schedule.

## 2019-05-01 ENCOUNTER — Other Ambulatory Visit: Payer: Self-pay | Admitting: Obstetrics and Gynecology

## 2019-05-01 DIAGNOSIS — Z30011 Encounter for initial prescription of contraceptive pills: Secondary | ICD-10-CM

## 2019-05-01 MED ORDER — LEVONORGESTREL-ETHINYL ESTRAD 0.1-20 MG-MCG PO TABS: ORAL_TABLET | ORAL | 0 refills | Status: DC

## 2019-05-01 NOTE — Telephone Encounter (Signed)
1 month rx sent. 

## 2019-05-19 ENCOUNTER — Ambulatory Visit: Payer: Medicaid Other | Admitting: Obstetrics and Gynecology

## 2019-05-24 ENCOUNTER — Other Ambulatory Visit: Payer: Self-pay

## 2019-05-24 ENCOUNTER — Encounter: Payer: Self-pay | Admitting: Emergency Medicine

## 2019-05-24 ENCOUNTER — Emergency Department
Admission: EM | Admit: 2019-05-24 | Discharge: 2019-05-24 | Disposition: A | Discharge status: Home / Self Care | Payer: BLUE CROSS/BLUE SHIELD | Attending: Student | Admitting: Student

## 2019-05-24 DIAGNOSIS — H109 Unspecified conjunctivitis: Secondary | ICD-10-CM | POA: Diagnosis present

## 2019-05-24 DIAGNOSIS — B309 Viral conjunctivitis, unspecified: Secondary | ICD-10-CM | POA: Insufficient documentation

## 2019-05-24 MED ORDER — ERYTHROMYCIN 5 MG/GM OP OINT
1.0000 "application " | TOPICAL_OINTMENT | Freq: Four times a day (QID) | OPHTHALMIC | Status: DC
  Administered 2019-05-24: 1 via OPHTHALMIC
  Filled 2019-05-24: qty 1

## 2019-05-24 MED ORDER — KETOROLAC TROMETHAMINE 0.5 % OP SOLN: 1.0000 [drp] | Freq: Four times a day (QID) | OPHTHALMIC | 0 refills | Status: DC

## 2019-05-24 NOTE — Discharge Instructions (Signed)
You have VIRAL conjunctivitis. It will resolve without treatment. Use the eye drops as directed for redness and irritation. Keep your hand clean before and after putting the medicine in your eyes. You may see [facility] for ongoing symptoms.

## 2019-05-24 NOTE — ED Provider Notes (Signed)
[facility] Emergency Department Provider Note ____________________________________________  Time seen: 1315  I have reviewed the triage vital signs and the nursing notes.  HISTORY  Chief Complaint  Conjunctivitis  HPI Nicole Kaufman is a 28 y.o. female presents itself to the ED for evaluation of continued bilateral eye irritation, redness, and copious tearing since Wednesday.  Patient describes onset at work, and she was asked to leave work on Thursday due to her presumed contagious infection.   Since that time she has been using over-the-counter medications without relief.  She denies any nausea, vomiting, dizziness.  She also denies any visual change, vertigo, or fevers.  She has had clear tearing drainage from the eyes with some intermittent crusting but denies any matting or purulence.  Past Medical History:  Diagnosis Date  . Medical history non-contributory     Patient Active Problem List   Diagnosis Date Noted  . Bacterial vaginosis 03/03/2019  . Postpartum care following vaginal delivery 02/02/2019  . Limited prenatal care in third trimester 01/31/2019  . Labor and delivery indication for care or intervention 01/31/2019  . Normal labor 01/31/2019  . Late prenatal care 11/12/2018  . Supervision of other normal pregnancy, antepartum 11/04/2018    Past Surgical History:  Procedure Laterality Date  . NO PAST SURGERIES      Prior to Admission medications   Medication Sig Start Date End Date Taking? Authorizing Provider  ketorolac (ACULAR) 0.5 % ophthalmic solution Place 1 drop into both eyes 4 (four) times daily. 05/24/19   Jaslin Novitski, Charlesetta Ivory, PA-C  levonorgestrel-ethinyl estradiol (LESSINA-28) 0.1-20 MG-MCG tablet TAKE 1 TABLET BY MOUTH EVERY DAY` 05/01/19   Vena Austria, MD  Prenat-FeFum-FePo-FA-Omega 3 (CONCEPT DHA) 53.5-38-1 MG CAPS Take 1 capsule by mouth daily. 11/12/18   Oswaldo Conroy, CNM    Allergies Patient has no known  allergies.  Family History  Problem Relation Age of Onset  . Congestive Heart Failure Mother   . Diabetes Mother   . Diabetes Father     Social History Social History   Tobacco Use  . Smoking status: Never Smoker  . Smokeless tobacco: Never Used  Substance Use Topics  . Alcohol use: No  . Drug use: No    Review of Systems  Constitutional: Negative for fever. Eyes: Negative for visual changes. Reports eye redness and drainage as above. ENT: Negative for sore throat. Cardiovascular: Negative for chest pain. Respiratory: Negative for shortness of breath. Gastrointestinal: Negative for abdominal pain, vomiting and diarrhea. Genitourinary: Negative for dysuria. Musculoskeletal: Negative for back pain. Skin: Negative for rash. Neurological: Negative for headaches, focal weakness or numbness. ____________________________________________  PHYSICAL EXAM:  VITAL SIGNS: ED Triage Vitals  Enc Vitals Group     BP 05/24/19 1304 117/82     Pulse Rate 05/24/19 1304 69     Resp 05/24/19 1304 16     Temp 05/24/19 1304 98 F (36.7 C)     Temp Source 05/24/19 1304 Oral     SpO2 05/24/19 1304 99 %     Weight 05/24/19 1303 150 lb (68 kg)     Height 05/24/19 1303 5\' 3"  (1.6 m)     Head Circumference --      Peak Flow --      Pain Score 05/24/19 1302 10     Pain Loc --      Pain Edu? --      Excl. in GC? --     Constitutional: Alert and oriented. Well appearing  and in no distress. Head: Normocephalic and atraumatic. Eyes: Conjunctivae are injected bilaterally. PERRL. Normal extraocular movements.  Copious tearing noted bilaterally. Hematological/Lymphatic/Immunological: Palpable preauricular lymphadenopathy noted. Cardiovascular: Normal rate, regular rhythm. Normal distal pulses. Respiratory: Normal respiratory effort.  Musculoskeletal: Nontender with normal range of motion in all extremities.  Neurologic:  Normal gait without ataxia. Normal speech and language. No gross focal  neurologic deficits are appreciated. Skin:  Skin is warm, dry and intact. No rash noted. ____________________________________________  PROCEDURES  Erythromycin ophthalmic ointment - OU Procedures ____________________________________________  INITIAL IMPRESSION / ASSESSMENT AND PLAN / ED COURSE  Patient with ED evaluation of bilateral eye irritation, redness, and tearing for the last 3 to 4 days.  Clinical picture is consistent with viral etiology secondary to pathopneumonic preauricular lymphadenopathy.  Patient is discharged with a prescription for Acular to use as directed.  She is also given a small starter sample of erythromycin ointment for soothing eye irritation.  She is discharged to return to work after being on antibiotic 24 hours despite the self-limited course of the viral infection.  Eye care doctors are provided.  Patient is discharged at this time.  Nicole Kaufman was evaluated in Emergency Department on 05/24/2019 for the symptoms described in the history of present illness. She was evaluated in the context of the global COVID-19 pandemic, which necessitated consideration that the patient might be at risk for infection with the SARS-CoV-2 virus that causes COVID-19. Institutional protocols and algorithms that pertain to the evaluation of patients at risk for COVID-19 are in a state of rapid change based on information released by regulatory bodies including the CDC and federal and state organizations. These policies and algorithms were followed during the patient's care in the ED. ____________________________________________  FINAL CLINICAL IMPRESSION(S) / ED DIAGNOSES  Final diagnoses:  Viral conjunctivitis of both eyes      Carmie End, Dannielle Karvonen, PA-C 05/24/19 1613    Lilia Pro., MD 05/25/19 1210

## 2019-05-24 NOTE — ED Notes (Signed)
See triage note  Presents with bilateral eye drainage and irritation for about 1 week

## 2019-05-24 NOTE — ED Triage Notes (Signed)
Pt to ED via POV c/o bilateral eye redness and drainage. Pt state that she has used OTC medication without relief. PT is in NAD.

## 2019-06-03 ENCOUNTER — Other Ambulatory Visit: Payer: Self-pay

## 2019-06-03 DIAGNOSIS — Z30011 Encounter for initial prescription of contraceptive pills: Secondary | ICD-10-CM

## 2019-06-03 NOTE — Telephone Encounter (Signed)
Patient has apt for next week 06/12/2019 w/AMS. Needs refill of birth control. Cb#[phone]

## 2019-06-08 MED ORDER — LEVONORGESTREL-ETHINYL ESTRAD 0.1-20 MG-MCG PO TABS: ORAL_TABLET | ORAL | 0 refills | Status: DC

## 2019-06-08 NOTE — Telephone Encounter (Signed)
Left detailed msg refill eRx'd. 

## 2019-06-12 ENCOUNTER — Ambulatory Visit: Payer: Medicaid Other | Admitting: Obstetrics and Gynecology

## 2019-08-06 NOTE — Telephone Encounter (Signed)
Pt has been seen since this msg. 

## 2019-08-13 ENCOUNTER — Other Ambulatory Visit: Payer: BLUE CROSS/BLUE SHIELD

## 2019-08-14 ENCOUNTER — Ambulatory Visit: Payer: Medicaid Other | Attending: Internal Medicine

## 2019-08-14 DIAGNOSIS — Z20822 Contact with and (suspected) exposure to covid-19: Secondary | ICD-10-CM

## 2019-08-16 LAB — NOVEL CORONAVIRUS, NAA: SARS-CoV-2, NAA: NOT DETECTED

## 2019-08-17 ENCOUNTER — Telehealth: Payer: Self-pay | Admitting: *Deleted

## 2019-08-17 NOTE — Telephone Encounter (Signed)
Patient called given negative covid results , 

## 2019-10-09 ENCOUNTER — Ambulatory Visit (INDEPENDENT_AMBULATORY_CARE_PROVIDER_SITE_OTHER): Payer: Medicaid Other | Admitting: Obstetrics and Gynecology

## 2019-10-09 ENCOUNTER — Encounter: Payer: Self-pay | Admitting: Obstetrics and Gynecology

## 2019-10-09 ENCOUNTER — Other Ambulatory Visit: Payer: Self-pay

## 2019-10-09 VITALS — BP 102/60 | HR 92 | Ht 63.0 in | Wt 142.0 lb

## 2019-10-09 DIAGNOSIS — Z3009 Encounter for other general counseling and advice on contraception: Secondary | ICD-10-CM

## 2019-10-09 DIAGNOSIS — Z308 Encounter for other contraceptive management: Secondary | ICD-10-CM

## 2019-10-09 DIAGNOSIS — Z30011 Encounter for initial prescription of contraceptive pills: Secondary | ICD-10-CM | POA: Diagnosis not present

## 2019-10-09 MED ORDER — LO LOESTRIN FE 1 MG-10 MCG / 10 MCG PO TABS: 1.0000 | ORAL_TABLET | Freq: Every day | ORAL | 3 refills | Status: DC

## 2019-10-09 NOTE — Progress Notes (Signed)
Obstetrics & Gynecology Office Visit   Chief Complaint:  Chief Complaint  Patient presents with  . Postpartum Care    vaginal delivery 01/2019, discuss OCP    History of Present Illness: Patient is a 29 y.o. [id number] presenting for contraception consult.  She is currently on none and desiring to start tubal ligation.  She has a past medical history significant for no contraindication to estrogen.  She specifically denies a history of migraine with aura, chronic hypertension, history of DVT/PE and smoking.  Reported Patient's last menstrual period was 09/26/2019.Marland Kitchen       Review of Systems: review of systems negative unless noted in HPI  Past Medical History:  Past Medical History:  Diagnosis Date  . Medical history non-contributory     Past Surgical History:  Past Surgical History:  Procedure Laterality Date  . NO PAST SURGERIES      Gynecologic History: Patient's last menstrual period was 09/26/2019.  Obstetric History: [id number]  Family History:  Family History  Problem Relation Age of Onset  . Congestive Heart Failure Mother   . Diabetes Mother   . Diabetes Father     Social History:  Social History   Socioeconomic History  . Marital status: Single    Spouse name: Not on file  . Number of children: Not on file  . Years of education: Not on file  . Highest education level: Not on file  Occupational History  . Not on file  Tobacco Use  . Smoking status: Never Smoker  . Smokeless tobacco: Never Used  Substance and Sexual Activity  . Alcohol use: No  . Drug use: No  . Sexual activity: Yes    Birth control/protection: None  Other Topics Concern  . Not on file  Social History Narrative  . Not on file   Social Determinants of Health   Financial Resource Strain: Low Risk   . Difficulty of Paying Living Expenses: Not hard at all  Food Insecurity: No Food Insecurity  . Worried About Programme researcher, broadcasting/film/video in the Last Year: Never true  . Ran Out of Food in  the Last Year: Never true  Transportation Needs: No Transportation Needs  . Lack of Transportation (Medical): No  . Lack of Transportation (Non-Medical): No  Physical Activity: Insufficiently Active  . Days of Exercise per Week: 2 days  . Minutes of Exercise per Session: 20 min  Stress: No Stress Concern Present  . Feeling of Stress : Not at all  Social Connections: Somewhat Isolated  . Frequency of Communication with Friends and Family: Twice a week  . Frequency of Social Gatherings with Friends and Family: Twice a week  . Attends Religious Services: 1 to 4 times per year  . Active Member of Clubs or Organizations: No  . Attends Banker Meetings: Never  . Marital Status: Never married  Intimate Partner Violence: Not At Risk  . Fear of Current or Ex-Partner: No  . Emotionally Abused: No  . Physically Abused: No  . Sexually Abused: No    Allergies:  No Known Allergies  Medications: Prior to Admission medications   Not on File    Physical Exam Vitals:  Vitals:   10/09/19 1402  BP: 102/60  Pulse: 92   Patient's last menstrual period was 09/26/2019.  General: NAD HEENT: normocephalic, anicteric Thyroid: no enlargement, no palpable nodules Pulmonary: No increased work of breathing Neurologic: Grossly intact Psychiatric: mood appropriate, affect full   Assessment: 29 y.o. [id number]  contraception consult  Plan: Problem List Items Addressed This Visit    None    Visit Diagnoses    Encounter for tubal ligation counseling    -  Primary   Oral contraception initial prescription         29 y.o. [id number]  with undesired fertility, desires permanent sterilization.  Other reversible forms of contraception were discussed with patient; she declines all other modalities. Permanent nature of as well as associated risks of the procedure discussed with patient including but not limited to: risk of regret, permanence of method, bleeding, infection, injury to surrounding  organs and need for additional procedures.  Failure risk of 0.5-1% with increased risk of ectopic gestation if pregnancy occurs was also discussed with patient.   - LoLoestrin samples provided - pap at time of preop - medicaid tubal paper signed today  Malachy Mood, MD, [place], [facility] 10/09/2019, 2:28 PM

## 2019-10-16 ENCOUNTER — Telehealth: Payer: Self-pay | Admitting: Obstetrics and Gynecology

## 2019-10-16 NOTE — Telephone Encounter (Signed)
-----   Message from Andreas Staebler, MD sent at 10/09/2019  5:56 PM EST ----- Regarding: Surgery Surgery Booking Request Patient Full Name:  Nicole Kaufman  MRN: [id number]  DOB: 11/29/1990  Surgeon: Andreas Staebler, MD  Requested Surgery Date and Time: 5-6 weeks Primary Diagnosis AND Code: Desires surgical sterilization Secondary Diagnosis and Code:  Surgical Procedure: laparoscopic bilateral salpingectomy L&D Notification: No Admission Status: same day surgery Length of Surgery: 1hr Special Case Needs: No H&P: Yes week prior Phone Interview???:  No Interpreter: No Language:  Medical Clearance:  No Special Scheduling Instructions: Medicaid Tubal paper signed 10/09/2019 Any known health/anesthesia issues, diabetes, sleep apnea, latex allergy, defibrillator/pacemaker?: No Acuity: P3   (P1 highest, P2 delay may cause harm, P3 low, elective gyn, P4 lowest)   

## 2019-10-16 NOTE — Telephone Encounter (Signed)
-----   Message from Vena Austria, MD sent at 10/09/2019  5:56 PM EST ----- Regarding: Surgery Surgery Booking Request Patient Full Name:  Nicole Kaufman  MRN: [id number]  DOB: [birth year]  Surgeon: Vena Austria, MD  Requested Surgery Date and Time: 5-6 weeks Primary Diagnosis AND Code: Desires surgical sterilization Secondary Diagnosis and Code:  Surgical Procedure: laparoscopic bilateral salpingectomy L&D Notification: No Admission Status: same day surgery Length of Surgery: 1hr Special Case Needs: No H&P: Yes week prior Phone Interview???:  No Interpreter: No Language:  Medical Clearance:  No Special Scheduling Instructions: Medicaid Tubal paper signed 10/09/2019 Any known health/anesthesia issues, diabetes, sleep apnea, latex allergy, defibrillator/pacemaker?: No Acuity: P3   (P1 highest, P2 delay may cause harm, P3 low, elective gyn, P4 lowest)

## 2019-10-16 NOTE — Telephone Encounter (Signed)
Called pt to offer surgery dates - no answer and v/m was full Will try back at another time.

## 2019-10-16 NOTE — Telephone Encounter (Signed)
Rtn call from pt to sch surgery  DOS 4/27 w/ Dr Bonney Aid  H&P 4/13 @ 2:50  Covid testing 4/23, [address], drive up and wear mask. Adv pt to quar after tested until DOS.  Adv of Pre-admit Phone appt and that date/time will be listed in MyChart and on paperwork recd at H&P appt. Adv that may also rec call from [facility] Rx and pt [facility] ctr  Conf pt has Medicaid as prim/only ins Medicaid consent form signed 10/09/19.  Note: Pt is nervous about procedure.

## 2019-11-13 ENCOUNTER — Other Ambulatory Visit: Payer: Self-pay

## 2019-11-13 ENCOUNTER — Emergency Department
Admission: EM | Admit: 2019-11-13 | Discharge: 2019-11-13 | Disposition: A | Discharge status: Home / Self Care | Payer: Medicaid Other | Attending: Emergency Medicine | Admitting: Emergency Medicine

## 2019-11-13 ENCOUNTER — Encounter: Payer: Self-pay | Admitting: Emergency Medicine

## 2019-11-13 ENCOUNTER — Emergency Department: Payer: Medicaid Other

## 2019-11-13 DIAGNOSIS — R220 Localized swelling, mass and lump, head: Secondary | ICD-10-CM | POA: Diagnosis present

## 2019-11-13 DIAGNOSIS — K029 Dental caries, unspecified: Secondary | ICD-10-CM | POA: Insufficient documentation

## 2019-11-13 DIAGNOSIS — R05 Cough: Secondary | ICD-10-CM | POA: Insufficient documentation

## 2019-11-13 DIAGNOSIS — K047 Periapical abscess without sinus: Secondary | ICD-10-CM | POA: Diagnosis not present

## 2019-11-13 DIAGNOSIS — Z79899 Other long term (current) drug therapy: Secondary | ICD-10-CM | POA: Diagnosis not present

## 2019-11-13 LAB — POCT PREGNANCY, URINE: Preg Test, Ur: NEGATIVE

## 2019-11-13 MED ORDER — LIDOCAINE HCL (PF) 1 % IJ SOLN
5.0000 mL | Freq: Once | INTRAMUSCULAR | Status: AC
  Administered 2019-11-13: 5 mL
  Filled 2019-11-13: qty 5

## 2019-11-13 MED ORDER — HYDROCODONE-ACETAMINOPHEN 5-325 MG PO TABS
1.0000 | ORAL_TABLET | Freq: Once | ORAL | Status: DC
  Filled 2019-11-13: qty 1

## 2019-11-13 MED ORDER — OXYCODONE-ACETAMINOPHEN 5-325 MG PO TABS
1.0000 | ORAL_TABLET | Freq: Once | ORAL | Status: AC
  Administered 2019-11-13: 10:00:00 1 via ORAL
  Filled 2019-11-13: qty 1

## 2019-11-13 MED ORDER — OXYCODONE-ACETAMINOPHEN 5-325 MG PO TABS: 1.0000 | ORAL_TABLET | Freq: Four times a day (QID) | ORAL | 0 refills | Status: DC | PRN

## 2019-11-13 MED ORDER — AMOXICILLIN 875 MG PO TABS: 875.0000 mg | ORAL_TABLET | Freq: Two times a day (BID) | ORAL | 0 refills | Status: DC

## 2019-11-13 MED ORDER — CEFTRIAXONE SODIUM 1 G IJ SOLR
1.0000 g | Freq: Once | INTRAMUSCULAR | Status: AC
  Administered 2019-11-13: 1 g via INTRAMUSCULAR
  Filled 2019-11-13: qty 10

## 2019-11-13 MED ORDER — ACETAMINOPHEN-CODEINE #3 300-30 MG PO TABS: 1.0000 | ORAL_TABLET | Freq: Once | ORAL | Status: DC

## 2019-11-13 NOTE — ED Notes (Signed)
See triage note  Presents with left side facial swelling  States she developed pain couple of days ago but woke up with am with swelling

## 2019-11-13 NOTE — Discharge Instructions (Addendum)
Follow-up with the dentist that you have already spoke to.  If you are not able to be seen next week then call one of the dental clinics listed on your discharge papers.  Also continue taking amoxicillin 875 twice daily for the next 10 days.  Percocet is for severe pain only.  Do not drive or operate machinery while taking that medication.  OPTIONS FOR DENTAL FOLLOW UP CARE  [place] [facility] and [place] [employer] [phone])  Nicole Kaufman [phone])  [place] [phone])  [facility] [phone])  [facility] [phone]) This clinic caters to the indigent population and is on a lottery system. Location: [employer] [facility], [place], [place], [place] Clinic Hours: Wednesdays from 6pm - 9pm, patients seen by a lottery system. For dates, call or go to [email or web address] Services: Cleanings, fillings and simple extractions. Payment Options: DENTAL WORK IS FREE OF CHARGE. Bring proof of income or support. Best way to get seen: Arrive at 5:15 pm - this is a lottery, NOT first come/first serve, so arriving earlier will not increase your chances of being seen.     [facility] [phone] Select option 1 for emergencies   Location: [facility], [place], [address], [place] Clinic Hours: No walk-ins accepted - call the day before to schedule an appointment. Check in times are 9:30 am and 1:30 pm. Services: Simple extractions, temporary fillings, pulpectomy/pulp debridement, uncomplicated abscess drainage. Payment Options: PAYMENT IS DUE AT THE TIME OF SERVICE.  Fee is usually $100-200, additional surgical procedures (e.g. abscess drainage) may be extra. Cash, checks, Visa/MasterCard  accepted.  Can file Medicaid if patient is covered for dental - patient should call case worker to check. No discount for [facility] patients. Best way to get seen: MUST call the day before and get onto the schedule. Can usually be seen the next 1-2 days. No walk-ins accepted.     [place] [phone]   Location: [place], [place] Clinic Hours: M, W, Th, F 8am or 1:30pm, Tues 9a or 1:30 - first come/first served. Services: Simple extractions, temporary fillings, uncomplicated abscess drainage.  You do not need to be an [facility] resident. Payment Options: PAYMENT IS DUE AT THE TIME OF SERVICE. Dental insurance, otherwise sliding scale - bring proof of income or support. Depending on income and treatment needed, cost is usually $50-200. Best way to get seen: Arrive early as it is first come/first served.     [facility] [phone]   Location: [place] [facility] Hours: Mon-Thu 8a-5p Services: Most basic dental services including extractions and fillings. Payment Options: PAYMENT IS DUE AT THE TIME OF SERVICE. Sliding scale, up to 50% off - bring proof if income or support. Medicaid with dental option accepted. Best way to get seen: Call to schedule an appointment, can usually be seen within 2 weeks OR they will try to see walk-ins - show up at [place] or 2p (you may have to wait).     [facility] [place] RESIDENTS ONLY   Location: [facility] [place] [address] [place], [place] [address] Clinic Hours: By appointment only. Monday - Thursday 8am-5pm, Friday 8am-12pm Services: Cleanings, fillings, extractions. Payment Options: PAYMENT IS DUE AT THE TIME OF SERVICE. Cash, [place] or  MasterCard. Sliding scale - $30 minimum per service. Best way to get seen: Come in to office, complete packet and make an  appointment - need proof of income or support monies for each household member and proof of [facility] residence. Usually takes about a month to get in.     [facility] [phone]   Location: [address], [facility] Clinic Hours: Walk-in Urgent Care Dental Services are offered Monday-Friday mornings only. The numbers of emergencies accepted daily is limited to the number of providers available. Maximum 15 - Mondays, Wednesdays & Thursdays Maximum 10 - Tuesdays & Fridays Services: You do not need to be a [facility] resident to be seen for a dental emergency. Emergencies are defined as pain, swelling, abnormal bleeding, or dental trauma. Walkins will receive x-rays if needed. NOTE: Dental cleaning is not an emergency. Payment Options: PAYMENT IS DUE AT THE TIME OF SERVICE. Minimum co-pay is $40.00 for uninsured patients. Minimum co-pay is $3.00 for Medicaid with dental coverage. Dental Insurance is accepted and must be presented at time of visit. Medicare does not cover dental. Forms of payment: Cash, credit card, checks. Best way to get seen: If not previously registered with the clinic, walk-in dental registration begins at 7:15 am and is on a first come/first serve basis. If previously registered with the clinic, call to make an appointment.     [facility] [phone] [place] RESIDENTS ONLY   Location: [address], [place], [place] Clinic Hours: Mon-Thu 10a-2p Services: Extractions only! Payment Options: FREE (donations accepted) - bring proof of income or support Best way to get seen: Call and schedule an appointment OR come at 8am on the 1st Monday of every month (except for holidays) when it is first come/first served.     [facility] [phone]   Location: [address] [place], [place] Clinic Hours: Friday mornings Services, Payment Options, Best way to get seen: Call for info

## 2019-11-13 NOTE — ED Triage Notes (Signed)
Pt to ED via POV c/o left sided facial swelling. Pt states that it has been going on for a few day but worse this morning. Pt denies known dental issue on that side but states that it may be an abscess. Pt is in NAD.

## 2019-11-13 NOTE — ED Provider Notes (Signed)
[facility] Emergency Department Provider Note  ____________________________________________   First MD Initiated Contact with Patient 11/13/19 [phone]     (approximate)  I have reviewed the triage vital signs and the nursing notes.   HISTORY  Chief Complaint Facial Pain   HPI Nicole Kaufman is a 29 y.o. female presents to the ED with complaint of left-sided facial swelling that has developed over the last several days.  Patient states she woke up this morning with increased swelling.  She denies any difficulty breathing and denies any fever, chills, nausea or vomiting.  She states this may be from a dental abscess that she has several teeth on the left side that "need to be seen".  Patient also complains of a cough that has been going on for several weeks but attributes it to her smoking.  She rates her pain as a 10/10.      Past Medical History:  Diagnosis Date  . Medical history non-contributory     Patient Active Problem List   Diagnosis Date Noted  . Bacterial vaginosis 03/03/2019    Past Surgical History:  Procedure Laterality Date  . NO PAST SURGERIES      Prior to Admission medications   Medication Sig Start Date End Date Taking? Authorizing Provider  amoxicillin (AMOXIL) 875 MG tablet Take 1 tablet (875 mg total) by mouth 2 (two) times daily. 11/13/19   Johnn Hai, PA-C  Norethindrone-Ethinyl Estradiol-Fe Biphas (LO LOESTRIN FE) 1 MG-10 MCG / 10 MCG tablet Take 1 tablet by mouth daily. 10/09/19 01/01/20  Malachy Mood, MD  oxyCODONE-acetaminophen (PERCOCET) 5-325 MG tablet Take 1 tablet by mouth every 6 (six) hours as needed for severe pain. 11/13/19   Johnn Hai, PA-C    Allergies Norco [hydrocodone-acetaminophen]  Family History  Problem Relation Age of Onset  . Congestive Heart Failure Mother   . Diabetes Mother   . Diabetes Father     Social History Social History   Tobacco Use  . Smoking status: Never Smoker   . Smokeless tobacco: Never Used  Substance Use Topics  . Alcohol use: No  . Drug use: No    Review of Systems Constitutional: No fever/chills Eyes: No visual changes. ENT: No sore throat.  Positive for dental pain. Cardiovascular: Denies chest pain. Respiratory: Denies shortness of breath. Gastrointestinal: No abdominal pain.  No nausea, no vomiting.  Musculoskeletal: Negative for back pain. Skin: Negative for rash. Neurological: Negative for headaches, focal weakness or numbness. ___________________________________________   PHYSICAL EXAM:  VITAL SIGNS: ED Triage Vitals  Enc Vitals Group     BP 11/13/19 0802 119/76     Pulse Rate 11/13/19 0802 70     Resp 11/13/19 0802 17     Temp 11/13/19 0802 98.3 F (36.8 C)     Temp Source 11/13/19 0802 Oral     SpO2 11/13/19 0802 100 %     Weight 11/13/19 0802 150 lb (68 kg)     Height 11/13/19 0802 5\' 3"  (1.6 m)     Head Circumference --      Peak Flow --      Pain Score 11/13/19 0808 10     Pain Loc --      Pain Edu? --      Excl. in [place]? --     Constitutional: Alert and oriented. Well appearing and in no acute distress. Eyes: Conjunctivae are normal. PERRL. EOMI. Head: Atraumatic. Nose: No congestion/rhinnorhea. Mouth/Throat: Mucous membranes are moist.  Oropharynx non-erythematous.  Left upper teeth with multiple severe dental cavities that are below the gumline.  No active drainage is noted.  Patient is moderately tender in this area which also has facial edema. Neck: No stridor.   Hematological/Lymphatic/Immunilogical: No cervical lymphadenopathy. Cardiovascular: Normal rate, regular rhythm. Grossly normal heart sounds.  Good peripheral circulation. Respiratory: Normal respiratory effort.  No retractions. Lungs no wheezes were noted however the patient did have a coarse cough with minimal wheeze. Musculoskeletal: Moves upper and lower extremities that any difficulty.  Normal gait was noted. Neurologic:  Normal speech  and language. No gross focal neurologic deficits are appreciated. No gait instability. Skin:  Skin is warm, dry and intact. No rash noted. Psychiatric: Mood and affect are normal. Speech and behavior are normal.  ____________________________________________   LABS (all labs ordered are listed, but only abnormal results are displayed)  Labs Reviewed  POC URINE PREG, ED  POCT PREGNANCY, URINE    RADIOLOGY  Official radiology report(s): DG Chest 2 View  Result Date: 11/13/2019 CLINICAL DATA:  Cough. EXAM: CHEST - 2 VIEW COMPARISON:  None. FINDINGS: The heart size and mediastinal contours are within normal limits. Both lungs are clear. The visualized skeletal structures are unremarkable. IMPRESSION: No active cardiopulmonary disease. Electronically Signed   By: Lupita Raider M.D.   On: 11/13/2019 09:35    ____________________________________________   PROCEDURES  Procedure(s) performed (including Critical Care):  Procedures   ____________________________________________   INITIAL IMPRESSION / ASSESSMENT AND PLAN / ED COURSE  As part of my medical decision making, I reviewed the following data within the electronic MEDICAL RECORD NUMBER Notes from prior ED visits and [place] Controlled Substance Database  29 year old female presents to the ED with complaint of left-sided facial swelling over the last several days.  Patient denies any fever or chills.  She states that she has some dental issues on that side that could have "abscessed".  On exam patient has multiple teeth and extremely poor repair with 2 below the surface of the gum.  Moderate facial edema with tenderness on palpation of the maxillary area.  Patient outcome so complains of a cough for several weeks and chest x-ray was negative.  Patient was given Rocephin 1 g IM while in the ED.  She is to continue with amoxicillin 875 twice daily for 10 days and Percocet as needed for pain.  She is to call her dentist but a list of dental  clinics was given to her on her discharge papers for other resources.  ____________________________________________   FINAL CLINICAL IMPRESSION(S) / ED DIAGNOSES  Final diagnoses:  Dental abscess  Left facial swelling     ED Discharge Orders         Ordered    amoxicillin (AMOXIL) 875 MG tablet  2 times daily     11/13/19 1015    oxyCODONE-acetaminophen (PERCOCET) 5-325 MG tablet  Every 6 hours PRN     11/13/19 1015           Note:  This document was prepared using Dragon voice recognition software and may include unintentional dictation errors.    Tommi Rumps, PA-C 11/13/19 1133    Sharyn Creamer, MD 11/13/19 1530

## 2019-11-17 ENCOUNTER — Other Ambulatory Visit: Payer: Self-pay

## 2019-11-17 ENCOUNTER — Ambulatory Visit (INDEPENDENT_AMBULATORY_CARE_PROVIDER_SITE_OTHER): Payer: Medicaid Other | Admitting: Obstetrics and Gynecology

## 2019-11-17 ENCOUNTER — Encounter: Payer: Self-pay | Admitting: Obstetrics and Gynecology

## 2019-11-17 VITALS — BP 98/66 | Ht 63.0 in | Wt 144.0 lb

## 2019-11-17 DIAGNOSIS — Z01818 Encounter for other preprocedural examination: Secondary | ICD-10-CM

## 2019-11-17 DIAGNOSIS — Z308 Encounter for other contraceptive management: Secondary | ICD-10-CM | POA: Diagnosis not present

## 2019-11-17 NOTE — Progress Notes (Signed)
Obstetrics & Gynecology Surgery H&P    Chief Complaint: Scheduled Surgery   History of Present Illness: Patient is a 29 y.o. [id number] presenting for scheduled laproscopic bilateral salpingectomy, for the treatment or further evaluation of undesired fertility.   Prior Treatments prior to proceeding with surgery include: counseling on LARCS and alternative contraceptions, permanent nature of procedure.    Review of Systems:10 point review of systems  Past Medical History:  Patient Active Problem List   Diagnosis Date Noted  . Bacterial vaginosis 03/03/2019    Past Surgical History:  Past Surgical History:  Procedure Laterality Date  . NO PAST SURGERIES      Family History:  Family History  Problem Relation Age of Onset  . Congestive Heart Failure Mother   . Diabetes Mother   . Diabetes Father     Social History:  Social History   Socioeconomic History  . Marital status: Single    Spouse name: Not on file  . Number of children: Not on file  . Years of education: Not on file  . Highest education level: Not on file  Occupational History  . Not on file  Tobacco Use  . Smoking status: Never Smoker  . Smokeless tobacco: Never Used  Substance and Sexual Activity  . Alcohol use: No  . Drug use: No  . Sexual activity: Yes    Birth control/protection: None  Other Topics Concern  . Not on file  Social History Narrative  . Not on file   Social Determinants of Health   Financial Resource Strain: Low Risk   . Difficulty of Paying Living Expenses: Not hard at all  Food Insecurity: No Food Insecurity  . Worried About Charity fundraiser in the Last Year: Never true  . Ran Out of Food in the Last Year: Never true  Transportation Needs: No Transportation Needs  . Lack of Transportation (Medical): No  . Lack of Transportation (Non-Medical): No  Physical Activity: Insufficiently Active  . Days of Exercise per Week: 2 days  . Minutes of Exercise per Session: 20 min    Stress: No Stress Concern Present  . Feeling of Stress : Not at all  Social Connections: Somewhat Isolated  . Frequency of Communication with Friends and Family: Twice a week  . Frequency of Social Gatherings with Friends and Family: Twice a week  . Attends Religious Services: 1 to 4 times per year  . Active Member of Clubs or Organizations: No  . Attends Archivist Meetings: Never  . Marital Status: Never married  Intimate Partner Violence: Not At Risk  . Fear of Current or Ex-Partner: No  . Emotionally Abused: No  . Physically Abused: No  . Sexually Abused: No    Allergies:  Allergies  Allergen Reactions  . Norco [Hydrocodone-Acetaminophen] Nausea And Vomiting    Medications: Prior to Admission medications   Medication Sig Start Date End Date Taking? Authorizing Provider  amoxicillin (AMOXIL) 875 MG tablet Take 1 tablet (875 mg total) by mouth 2 (two) times daily. 11/13/19  Yes Letitia Neri L, PA-C  Norethindrone-Ethinyl Estradiol-Fe Biphas (LO LOESTRIN FE) 1 MG-10 MCG / 10 MCG tablet Take 1 tablet by mouth daily. 10/09/19 01/01/20 Yes Malachy Mood, MD  oxyCODONE-acetaminophen (PERCOCET) 5-325 MG tablet Take 1 tablet by mouth every 6 (six) hours as needed for severe pain. 11/13/19  Yes Johnn Hai, PA-C    Physical Exam Vitals: Blood pressure 98/66, height 5\' 3"  (1.6 m), weight 144 lb (65.3 kg),  last menstrual period 11/09/2019, not currently breastfeeding. General: NAD HEENT: normocephalic, anicteric Pulmonary: No increased work of breathing, CTAB Cardiovascular: RRR, distal pulses 2+ Abdomen: soft, non-tender, non-distended Extremities: no edema, erythema, or tenderness Neurologic: Grossly intact Psychiatric: mood appropriate, affect full  Imaging DG Chest 2 View  Result Date: 11/13/2019 CLINICAL DATA:  Cough. EXAM: CHEST - 2 VIEW COMPARISON:  None. FINDINGS: The heart size and mediastinal contours are within normal limits. Both lungs are clear. The  visualized skeletal structures are unremarkable. IMPRESSION: No active cardiopulmonary disease. Electronically Signed   By: Lupita Raider M.D.   On: 11/13/2019 09:35    Assessment: 29 y.o. [id number] presenting for scheduled laparoscopic bilateral salpingectomy  Plan: 1) 29 y.o. [id number]  with undesired fertility, desires permanent sterilization.  Other reversible forms of contraception were discussed with patient; she declines all other modalities. Permanent nature of as well as associated risks of the procedure discussed with patient including but not limited to: risk of regret, permanence of method, bleeding, infection, injury to surrounding organs and need for additional procedures.  Failure risk of 0.5-1% with increased risk of ectopic gestation if pregnancy occurs was also discussed with patient.     2) Routine postoperative instructions were reviewed with the patient and her family in detail today including the expected length of recovery and likely postoperative course.  The patient concurred with the proposed plan, giving informed written consent for the surgery today.  Patient instructed on the importance of being NPO after midnight prior to her procedure.  If warranted preoperative prophylactic antibiotics and SCDs ordered on call to the OR to meet SCIP guidelines and adhere to recommendation laid forth in ACOG Practice Bulletin Number 104 May 2009  "Antibiotic Prophylaxis for Gynecologic Procedures".    3) A total of 15 minutes were spent in face-to-face contact with the patient during this encounter with over half of that time devoted to counseling and coordination of care.  4) Return in about 3 weeks (around 12/08/2019) for postop.    Vena Austria, MD, [facility], [facility] 11/17/2019, 3:23 PM

## 2019-11-25 ENCOUNTER — Encounter
Admission: RE | Admit: 2019-11-25 | Discharge: 2019-11-25 | Disposition: A | Discharge status: Home / Self Care | Payer: Medicaid Other | Source: Ambulatory Visit | Attending: Obstetrics and Gynecology | Admitting: Obstetrics and Gynecology

## 2019-11-25 ENCOUNTER — Other Ambulatory Visit: Payer: Self-pay

## 2019-11-25 HISTORY — DX: Anemia, unspecified: D64.9

## 2019-11-25 NOTE — Patient Instructions (Signed)
Your procedure is scheduled on: 12/01/19 Report to [place]. To find out your arrival time please call [phone] between 1PM - 3PM on 11/30/19.  Remember: Instructions that are not followed completely may result in serious medical risk, up to and including death, or upon the discretion of your surgeon and anesthesiologist your surgery may need to be rescheduled.     _X__ 1. Do not eat food after midnight the night before your procedure.                 No gum chewing or hard candies. You may drink clear liquids up to 2 hours                 before you are scheduled to arrive for your surgery- DO not drink clear                 liquids within 2 hours of the start of your surgery.                 Clear Liquids include:  water, apple juice without pulp, clear carbohydrate                 drink such as Clearfast or Gatorade, Black Coffee or Tea (Do not add                 anything to coffee or tea). Diabetics water only  __X__2.  On the morning of surgery brush your teeth with toothpaste and water, you                 may rinse your mouth with mouthwash if you wish.  Do not swallow any              toothpaste of mouthwash.     _X__ 3.  No Alcohol for 24 hours before or after surgery.   _X__ 4.  Do Not Smoke or use e-cigarettes For 24 Hours Prior to Your Surgery.                 Do not use any chewable tobacco products for at least 6 hours prior to                 surgery.  ____  5.  Bring all medications with you on the day of surgery if instructed.   __X__  6.  Notify your doctor if there is any change in your medical condition      (cold, fever, infections).     Do not wear jewelry, make-up, hairpins, clips or nail polish. Do not wear lotions, powders, or perfumes.  Do not shave 48 hours prior to surgery. Men may shave face and neck. Do not bring valuables to the hospital.    [facility] is not responsible for any belongings or  valuables.  Contacts, dentures/partials or body piercings may not be worn into surgery. Bring a case for your contacts, glasses or hearing aids, a denture cup will be supplied. Leave your suitcase in the car. After surgery it may be brought to your room. For patients admitted to the hospital, discharge time is determined by your treatment team.   Patients discharged the day of surgery will not be allowed to drive home.   Please read over the following fact sheets that you were given:   MRSA Information  __X__ Take these medicines the morning of surgery with A SIP OF WATER:  1. none  2.   3.   4.  5.  6.  ____ Fleet Enema (as directed)   __X__ Use CHG Soap/SAGE wipes as directed  ____ Use inhalers on the day of surgery  ____ Stop metformin/Janumet/Farxiga 2 days prior to surgery    ____ Take 1/2 of usual insulin dose the night before surgery. No insulin the morning          of surgery.   ____ Stop Blood Thinners Coumadin/Plavix/Xarelto/Pleta/Pradaxa/Eliquis/Effient/Aspirin  on   Or contact your Surgeon, Cardiologist or Medical Doctor regarding  ability to stop your blood thinners  __X__ Stop Anti-inflammatories 7 days before surgery such as Advil, Ibuprofen, Motrin,  BC or Goodies Powder, Naprosyn, Naproxen, Aleve, Aspirin    __X__ Stop all herbal supplements, fish oil or vitamin E until after surgery.    ____ Bring C-Pap to the hospital.    ENSURE PRE SURGERY DRINK NEEDS TO BE COMPLETED 2 HOURS BEFORE YOUR ARRIVAL TO SURGERY.

## 2019-11-27 ENCOUNTER — Other Ambulatory Visit
Admission: RE | Admit: 2019-11-27 | Discharge: 2019-11-27 | Disposition: A | Discharge status: Home / Self Care | Payer: Medicaid Other | Source: Ambulatory Visit | Attending: Obstetrics and Gynecology | Admitting: Obstetrics and Gynecology

## 2019-11-27 ENCOUNTER — Other Ambulatory Visit: Payer: Self-pay

## 2019-11-27 DIAGNOSIS — Z01812 Encounter for preprocedural laboratory examination: Secondary | ICD-10-CM | POA: Diagnosis present

## 2019-11-27 DIAGNOSIS — Z20822 Contact with and (suspected) exposure to covid-19: Secondary | ICD-10-CM | POA: Insufficient documentation

## 2019-11-27 LAB — CBC
HCT: 42 % (ref 36.0–46.0)
Hemoglobin: 13 g/dL (ref 12.0–15.0)
MCH: 24.3 pg — ABNORMAL LOW (ref 26.0–34.0)
MCHC: 31 g/dL (ref 30.0–36.0)
MCV: 78.7 fL — ABNORMAL LOW (ref 80.0–100.0)
Platelets: 220 10*3/uL (ref 150–400)
RBC: 5.34 MIL/uL — ABNORMAL HIGH (ref 3.87–5.11)
RDW: 12.8 % (ref 11.5–15.5)
WBC: 11.2 10*3/uL — ABNORMAL HIGH (ref 4.0–10.5)
nRBC: 0 % (ref 0.0–0.2)

## 2019-11-27 LAB — SARS CORONAVIRUS 2 (TAT 6-24 HRS): SARS Coronavirus 2: NEGATIVE

## 2019-11-27 NOTE — Pre-Procedure Instructions (Signed)
Pre-Admit Testing Provider Notification Note  Provider Notified: Dr. Staebler  Notification Mode: Fax  Reason: Abnormal lab result.  Response: Fax confirmation received.   Additional Information: Placed on chart. Noted on Pre-Admit Worksheet.  Signed: Irelynn Schermerhorn, RN  

## 2019-12-01 ENCOUNTER — Encounter
Admission: RE | Disposition: A | Discharge status: Home / Self Care | Payer: Self-pay | Source: Home / Self Care | Attending: Obstetrics and Gynecology

## 2019-12-01 ENCOUNTER — Other Ambulatory Visit: Payer: Self-pay

## 2019-12-01 ENCOUNTER — Ambulatory Visit
Admission: RE | Admit: 2019-12-01 | Discharge: 2019-12-01 | Disposition: A | Discharge status: Home / Self Care | Payer: Medicaid Other | Attending: Obstetrics and Gynecology | Admitting: Obstetrics and Gynecology

## 2019-12-01 ENCOUNTER — Ambulatory Visit: Payer: Medicaid Other | Admitting: Anesthesiology

## 2019-12-01 ENCOUNTER — Encounter: Payer: Self-pay | Admitting: Obstetrics and Gynecology

## 2019-12-01 DIAGNOSIS — Z8249 Family history of ischemic heart disease and other diseases of the circulatory system: Secondary | ICD-10-CM | POA: Diagnosis not present

## 2019-12-01 DIAGNOSIS — Z9889 Other specified postprocedural states: Secondary | ICD-10-CM

## 2019-12-01 DIAGNOSIS — Z885 Allergy status to narcotic agent status: Secondary | ICD-10-CM | POA: Diagnosis not present

## 2019-12-01 DIAGNOSIS — Z833 Family history of diabetes mellitus: Secondary | ICD-10-CM | POA: Diagnosis not present

## 2019-12-01 DIAGNOSIS — Z302 Encounter for sterilization: Secondary | ICD-10-CM

## 2019-12-01 DIAGNOSIS — Z3009 Encounter for other general counseling and advice on contraception: Secondary | ICD-10-CM

## 2019-12-01 HISTORY — PX: LAPAROSCOPIC BILATERAL SALPINGECTOMY: SHX5889

## 2019-12-01 LAB — POCT PREGNANCY, URINE: Preg Test, Ur: NEGATIVE

## 2019-12-01 SURGERY — SALPINGECTOMY, BILATERAL, LAPAROSCOPIC
Anesthesia: General | Site: Abdomen | Laterality: Bilateral

## 2019-12-01 MED ORDER — ONDANSETRON HCL 4 MG/2ML IJ SOLN
INTRAMUSCULAR | Status: DC | PRN
  Administered 2019-12-01: 4 mg via INTRAVENOUS

## 2019-12-01 MED ORDER — DEXAMETHASONE SODIUM PHOSPHATE 10 MG/ML IJ SOLN
INTRAMUSCULAR | Status: DC | PRN
  Administered 2019-12-01: 10 mg via INTRAVENOUS

## 2019-12-01 MED ORDER — BUPIVACAINE HCL 0.5 % IJ SOLN
INTRAMUSCULAR | Status: DC | PRN
  Administered 2019-12-01: 10 mL

## 2019-12-01 MED ORDER — FAMOTIDINE 20 MG PO TABS
ORAL_TABLET | ORAL | Status: AC
  Administered 2019-12-01: 20 mg via ORAL
  Filled 2019-12-01: qty 1

## 2019-12-01 MED ORDER — FENTANYL CITRATE (PF) 100 MCG/2ML IJ SOLN
25.0000 ug | INTRAMUSCULAR | Status: DC | PRN
  Administered 2019-12-01 (×2): 50 ug via INTRAVENOUS

## 2019-12-01 MED ORDER — ONDANSETRON HCL 4 MG/2ML IJ SOLN: 4.0000 mg | Freq: Once | INTRAMUSCULAR | Status: DC | PRN

## 2019-12-01 MED ORDER — MIDAZOLAM HCL 2 MG/2ML IJ SOLN
INTRAMUSCULAR | Status: DC | PRN
  Administered 2019-12-01: 2 mg via INTRAVENOUS

## 2019-12-01 MED ORDER — BUPIVACAINE HCL (PF) 0.5 % IJ SOLN
INTRAMUSCULAR | Status: AC
  Filled 2019-12-01: qty 30

## 2019-12-01 MED ORDER — ROCURONIUM BROMIDE 10 MG/ML (PF) SYRINGE
PREFILLED_SYRINGE | INTRAVENOUS | Status: AC
  Filled 2019-12-01: qty 10

## 2019-12-01 MED ORDER — ROCURONIUM BROMIDE 100 MG/10ML IV SOLN
INTRAVENOUS | Status: DC | PRN
  Administered 2019-12-01: 50 mg via INTRAVENOUS
  Administered 2019-12-01: 10 mg via INTRAVENOUS

## 2019-12-01 MED ORDER — IBUPROFEN 600 MG PO TABS
ORAL_TABLET | ORAL | Status: AC
  Filled 2019-12-01: qty 1

## 2019-12-01 MED ORDER — LIDOCAINE HCL (PF) 2 % IJ SOLN
INTRAMUSCULAR | Status: AC
  Filled 2019-12-01: qty 5

## 2019-12-01 MED ORDER — FENTANYL CITRATE (PF) 100 MCG/2ML IJ SOLN
INTRAMUSCULAR | Status: AC
  Filled 2019-12-01: qty 2

## 2019-12-01 MED ORDER — OXYCODONE HCL 5 MG PO TABS
ORAL_TABLET | ORAL | Status: AC
  Filled 2019-12-01: qty 1

## 2019-12-01 MED ORDER — SUGAMMADEX SODIUM 200 MG/2ML IV SOLN
INTRAVENOUS | Status: DC | PRN
  Administered 2019-12-01: 200 mg via INTRAVENOUS

## 2019-12-01 MED ORDER — FENTANYL CITRATE (PF) 100 MCG/2ML IJ SOLN
INTRAMUSCULAR | Status: DC | PRN
  Administered 2019-12-01: 100 ug via INTRAVENOUS

## 2019-12-01 MED ORDER — LACTATED RINGERS IV SOLN: INTRAVENOUS | Status: DC

## 2019-12-01 MED ORDER — LIDOCAINE HCL (CARDIAC) PF 100 MG/5ML IV SOSY
PREFILLED_SYRINGE | INTRAVENOUS | Status: DC | PRN
  Administered 2019-12-01: 100 mg via INTRAVENOUS

## 2019-12-01 MED ORDER — DEXAMETHASONE SODIUM PHOSPHATE 10 MG/ML IJ SOLN
INTRAMUSCULAR | Status: AC
  Filled 2019-12-01: qty 1

## 2019-12-01 MED ORDER — PROPOFOL 10 MG/ML IV BOLUS
INTRAVENOUS | Status: AC
  Filled 2019-12-01: qty 20

## 2019-12-01 MED ORDER — IBUPROFEN 600 MG PO TABS
600.0000 mg | ORAL_TABLET | Freq: Four times a day (QID) | ORAL | Status: DC | PRN
  Administered 2019-12-01: 600 mg via ORAL
  Filled 2019-12-01: qty 1

## 2019-12-01 MED ORDER — SILVER NITRATE-POT NITRATE 75-25 % EX MISC
CUTANEOUS | Status: AC
  Filled 2019-12-01: qty 10

## 2019-12-01 MED ORDER — OXYCODONE-ACETAMINOPHEN 5-325 MG PO TABS: 1.0000 | ORAL_TABLET | ORAL | 0 refills | Status: DC | PRN

## 2019-12-01 MED ORDER — OXYCODONE HCL 5 MG PO TABS
5.0000 mg | ORAL_TABLET | Freq: Once | ORAL | Status: AC
  Administered 2019-12-01: 5 mg via ORAL

## 2019-12-01 MED ORDER — PROPOFOL 10 MG/ML IV BOLUS
INTRAVENOUS | Status: DC | PRN
  Administered 2019-12-01: 140 mg via INTRAVENOUS

## 2019-12-01 MED ORDER — ONDANSETRON HCL 4 MG/2ML IJ SOLN
INTRAMUSCULAR | Status: AC
  Filled 2019-12-01: qty 2

## 2019-12-01 MED ORDER — LACTATED RINGERS IR SOLN
Status: DC | PRN
  Administered 2019-12-01: 300 mL

## 2019-12-01 MED ORDER — FAMOTIDINE 20 MG PO TABS: 20.0000 mg | ORAL_TABLET | Freq: Once | ORAL | Status: AC

## 2019-12-01 MED ORDER — MIDAZOLAM HCL 2 MG/2ML IJ SOLN
INTRAMUSCULAR | Status: AC
  Filled 2019-12-01: qty 2

## 2019-12-01 MED ORDER — IBUPROFEN 600 MG PO TABS
600.0000 mg | ORAL_TABLET | Freq: Four times a day (QID) | ORAL | 3 refills | Status: DC | PRN
Start: 2019-12-01 — End: 2020-06-05

## 2019-12-01 SURGICAL SUPPLY — 38 items
ANCHOR TIS RET SYS 235ML (MISCELLANEOUS) IMPLANT
APPLICATOR ARISTA FLEXITIP XL (MISCELLANEOUS) ×2 IMPLANT
BAG URINE DRAIN 2000ML AR STRL (UROLOGICAL SUPPLIES) ×3 IMPLANT
BLADE SURG SZ11 CARB STEEL (BLADE) ×3 IMPLANT
CANISTER SUCT 1200ML W/VALVE (MISCELLANEOUS) ×3 IMPLANT
CATH FOLEY 2WAY  5CC 16FR (CATHETERS) ×2
CATH URTH 16FR FL 2W BLN LF (CATHETERS) ×1 IMPLANT
CHLORAPREP W/TINT 26 (MISCELLANEOUS) ×3 IMPLANT
COVER WAND RF STERILE (DRAPES) ×3 IMPLANT
DERMABOND ADVANCED (GAUZE/BANDAGES/DRESSINGS) ×2
DERMABOND ADVANCED .7 DNX12 (GAUZE/BANDAGES/DRESSINGS) ×1 IMPLANT
GLOVE BIO SURGEON STRL SZ7 (GLOVE) ×3 IMPLANT
GLOVE INDICATOR 7.5 STRL GRN (GLOVE) ×3 IMPLANT
GOWN STRL REUS W/ TWL LRG LVL3 (GOWN DISPOSABLE) ×2 IMPLANT
GOWN STRL REUS W/ TWL XL LVL3 (GOWN DISPOSABLE) IMPLANT
GOWN STRL REUS W/TWL LRG LVL3 (GOWN DISPOSABLE) ×4
GOWN STRL REUS W/TWL XL LVL3 (GOWN DISPOSABLE) ×2
GRASPER SUT TROCAR 14GX15 (MISCELLANEOUS) IMPLANT
HEMOSTAT ARISTA ABSORB 3G PWDR (HEMOSTASIS) ×2 IMPLANT
IRRIGATION STRYKERFLOW (MISCELLANEOUS) IMPLANT
IRRIGATOR STRYKERFLOW (MISCELLANEOUS)
IV LACTATED RINGERS 1000ML (IV SOLUTION) ×3 IMPLANT
KIT PINK PAD W/HEAD ARE REST (MISCELLANEOUS) ×3
KIT PINK PAD W/HEAD ARM REST (MISCELLANEOUS) ×1 IMPLANT
KIT TURNOVER CYSTO (KITS) ×3 IMPLANT
LABEL OR SOLS (LABEL) ×3 IMPLANT
NS IRRIG 500ML POUR BTL (IV SOLUTION) ×3 IMPLANT
PACK GYN LAPAROSCOPIC (MISCELLANEOUS) ×3 IMPLANT
PAD OB MATERNITY 4.3X12.25 (PERSONAL CARE ITEMS) ×3 IMPLANT
PAD PREP 24X41 OB/GYN DISP (PERSONAL CARE ITEMS) ×3 IMPLANT
SCISSORS METZENBAUM CVD 33 (INSTRUMENTS) IMPLANT
SET TUBE SMOKE EVAC HIGH FLOW (TUBING) ×3 IMPLANT
SHEARS HARMONIC ACE PLUS 36CM (ENDOMECHANICALS) ×2 IMPLANT
SLEEVE ENDOPATH XCEL 5M (ENDOMECHANICALS) ×4 IMPLANT
SUT MNCRL AB 4-0 PS2 18 (SUTURE) ×3 IMPLANT
SUT VIC AB 2-0 UR6 27 (SUTURE) ×3 IMPLANT
TROCAR ENDO BLADELESS 11MM (ENDOMECHANICALS) IMPLANT
TROCAR XCEL NON-BLD 5MMX100MML (ENDOMECHANICALS) ×5 IMPLANT

## 2019-12-01 NOTE — Transfer of Care (Signed)
Immediate Anesthesia Transfer of Care Note  Patient: Nicole Kaufman  Procedure(s) Performed: LAPAROSCOPIC BILATERAL SALPINGECTOMY (Bilateral Abdomen)  Patient Location: PACU  Anesthesia Type:General  Level of Consciousness: awake, alert  and oriented  Airway & Oxygen Therapy: Patient Spontanous Breathing and Patient connected to face mask oxygen  Post-op Assessment: Report given to RN and Post -op Vital signs reviewed and stable  Post vital signs: Reviewed and stable  Last Vitals:  Vitals Value Taken Time  BP 125/77 12/01/19 1345  Temp 36.4 C 12/01/19 1345  Pulse 87 12/01/19 1347  Resp 16 12/01/19 1347  SpO2 100 % 12/01/19 1347  Vitals shown include unvalidated device data.  Last Pain:  Vitals:   12/01/19 1047  TempSrc: Oral  PainSc: 0-No pain         Complications: No apparent anesthesia complications

## 2019-12-01 NOTE — Op Note (Signed)
Preoperative Diagnosis: 1) 29 y.o.  with undesired fertility  Postoperative Diagnosis: 1) 29 y.o. with undesired fertility  Operation Performed: Laparoscopic bilateral tubal ligation via falope ring application  Indication: 29 y.o. [id number]  with undesired fertility, desires permanent sterilization.  Other reversible forms of contraception were discussed with patient; she declines all other modalities. Permanent nature of as well as associated risks of the procedure discussed with patient including but not limited to: risk of regret, permanence of method, bleeding, infection, injury to surrounding organs and need for additional procedures.    Surgeon: Vena Austria, MD  Anesthesia: General  Preoperative Antibiotics: none  Estimated Blood Loss: 100 mL  IV Fluids:  Urine Output::  Drains or Tubes: none  Implants: none  Specimens Removed: bilateral fallopian tubes  Complications: none  Intraoperative Findings: Normal tubes, ovaries, and uterus.   Patient Condition: stable  Procedure in Detail:  Patient was taken to the operating room where she was administered general anesthesia.  She was positioned in the dorsal lithotomy position utilizing Allen stirups, prepped and draped in the usual sterile fashion.  Prior to proceeding with procedure a time out was performed.  Attention was turned to the patient's pelvis.  A red rubber catheter was used to empty the patient's bladder.  An operative speculum was placed to allow visualization of the cervix.  The anterior lip of the cervix was grasped with a single tooth tenaculum, and a Hulka tenaculum was placed to allow manipulation of the uterus.  The operative speculum and single tooth tenaculum were then removed.  Attention was turned to the patient's abdomen.  The umbilicus was infiltrated with 1% Sensorcaine, before making a stab incision using an 11 blade scalpel.  A 27mm Excel trocar was then used to gain direct entry into  the peritoneal cavity utilizing the camera to visualize progress of the trocar during placement.  Once peritoneal entry had been achieved, insufflation was started and pneumoperitoneum established at a pressure of .   General inspection of the abdomen revealed the above noted findings.  A spot in the left lower quadrant and left upper quadrant at palmer's point were injected with 1% Sensorcaine and a stab incisions were made using an 11 blade scalpel.  Two additional 49mm Excel trocars were place through these incision under direct visualization.  The left tube was identified and grasped in a fimbriated portion, its attachments to the ovary, mesosalpinx, and uterus were transected using a 12mm Harmonic scalpel.  The same procedure was repeated on the left tube.  The specimens were removed through the left lower quadrant trocar site.  Inspection of the pedicles revealed inadequate hemostasis on the right mesosalpinx pedicle.  Bipolar energy was used to achieve hemostasis.  The pelvis was irrigated.  5g of Arista was applied to all pedicles.  Pneumoperitoneum was evacuated and pedicles were observes pneumoperitoneum was evacuated with continued hemostasis noted.   The trocars were removed.   All trocar sites were then dressed with surgical skin glue.  The Hulka tenaculum was removed.  Sponge needle and instrument counts were correct time two.  The patient tolerated the procedure well and was taken to the recovery room in stable condition.

## 2019-12-01 NOTE — Anesthesia Postprocedure Evaluation (Signed)
Anesthesia Post Note  Patient: Nicole Kaufman  Procedure(s) Performed: LAPAROSCOPIC BILATERAL SALPINGECTOMY (Bilateral Abdomen)  Patient location during evaluation: PACU Anesthesia Type: General Level of consciousness: awake and alert and oriented Pain management: pain level controlled Vital Signs Assessment: post-procedure vital signs reviewed and stable Respiratory status: spontaneous breathing, nonlabored ventilation and respiratory function stable Cardiovascular status: blood pressure returned to baseline and stable Postop Assessment: no signs of nausea or vomiting Anesthetic complications: no     Last Vitals:  Vitals:   12/01/19 1437 12/01/19 1500  BP: 139/69 113/74  Pulse: 60 74  Resp: 16 16  Temp: (!) 36.3 C   SpO2: 100% 100%    Last Pain:  Vitals:   12/01/19 1500  TempSrc:   PainSc: 4                  Chessa Barrasso

## 2019-12-01 NOTE — Anesthesia Preprocedure Evaluation (Addendum)
Anesthesia Evaluation  Patient identified by MRN, date of birth, ID band Patient awake    Reviewed: Allergy & Precautions, H&P , NPO status , Patient's Chart, lab work & pertinent test results  History of Anesthesia Complications Negative for: history of anesthetic complications  Airway Mallampati: III  TM Distance: >3 FB Neck ROM: Full    Dental no notable dental hx.    Pulmonary neg pulmonary ROS, neg sleep apnea, neg COPD,           Cardiovascular Exercise Tolerance: Good (-) hypertension(-) CAD and (-) Past MI negative cardio ROS       Neuro/Psych negative neurological ROS  negative psych ROS   GI/Hepatic negative GI ROS, Neg liver ROS,   Endo/Other  negative endocrine ROSneg diabetes  Renal/GU negative Renal ROS     Musculoskeletal negative musculoskeletal ROS (+)   Abdominal (+) - obese,   Peds  Hematology negative hematology ROS (+)   Anesthesia Other Findings Past Medical History: No date: Anemia No date: Medical history non-contributory  Past Surgical History: No date: NO PAST SURGERIES     Reproductive/Obstetrics negative OB ROS                            Anesthesia Physical Anesthesia Plan  ASA: I  Anesthesia Plan: General ETT   Post-op Pain Management:    Induction: Intravenous  PONV Risk Score and Plan: 2 and Ondansetron, Dexamethasone, Midazolam and Treatment may vary due to age or medical condition  Airway Management Planned: Oral ETT  Additional Equipment:   Intra-op Plan:   Post-operative Plan: Extubation in OR  Informed Consent: I have reviewed the patients History and Physical, chart, labs and discussed the procedure including the risks, benefits and alternatives for the proposed anesthesia with the patient or authorized representative who has indicated his/her understanding and acceptance.     Dental Advisory Given  Plan Discussed with:  Anesthesiologist  Anesthesia Plan Comments:        Anesthesia Quick Evaluation

## 2019-12-01 NOTE — Discharge Instructions (Signed)

## 2019-12-01 NOTE — Anesthesia Procedure Notes (Signed)
Procedure Name: Intubation Date/Time: 12/01/2019 12:05 PM Performed by: Rona Ravens, CRNA Pre-anesthesia Checklist: Patient identified, Emergency Drugs available, Suction available, Patient being monitored and Timeout performed Patient Re-evaluated:Patient Re-evaluated prior to induction Oxygen Delivery Method: Circle system utilized Preoxygenation: Pre-oxygenation with 100% oxygen Induction Type: IV induction Ventilation: Mask ventilation without difficulty Laryngoscope Size: Mac and 3 Grade View: Grade II Tube type: Oral Tube size: 7.0 mm Number of attempts: 1 Airway Equipment and Method: Stylet Placement Confirmation: ETT inserted through vocal cords under direct vision,  positive ETCO2,  CO2 detector and breath sounds checked- equal and bilateral Secured at: 21 cm Tube secured with: Tape Dental Injury: Teeth and Oropharynx as per pre-operative assessment

## 2019-12-01 NOTE — H&P (Signed)
Date of Initial H&P: 11/17/2019  History reviewed, patient examined, no change in status, stable for surgery.

## 2019-12-02 LAB — SURGICAL PATHOLOGY

## 2019-12-08 ENCOUNTER — Ambulatory Visit: Payer: Medicaid Other | Admitting: Obstetrics and Gynecology

## 2019-12-11 ENCOUNTER — Other Ambulatory Visit: Payer: Self-pay

## 2019-12-11 NOTE — Telephone Encounter (Signed)
Advise. Also see pt message

## 2019-12-14 MED ORDER — OXYCODONE-ACETAMINOPHEN 5-325 MG PO TABS: 1.0000 | ORAL_TABLET | ORAL | 0 refills | Status: DC | PRN

## 2019-12-24 ENCOUNTER — Other Ambulatory Visit: Payer: Self-pay | Admitting: Obstetrics and Gynecology

## 2019-12-24 DIAGNOSIS — Z30011 Encounter for initial prescription of contraceptive pills: Secondary | ICD-10-CM

## 2019-12-24 NOTE — Telephone Encounter (Signed)
Please advise if refill is appropriate 

## 2020-06-05 ENCOUNTER — Other Ambulatory Visit: Payer: Self-pay

## 2020-06-05 ENCOUNTER — Emergency Department
Admission: EM | Admit: 2020-06-05 | Discharge: 2020-06-05 | Disposition: A | Discharge status: Home / Self Care | Payer: Medicaid Other | Attending: Emergency Medicine | Admitting: Emergency Medicine

## 2020-06-05 DIAGNOSIS — K047 Periapical abscess without sinus: Secondary | ICD-10-CM

## 2020-06-05 DIAGNOSIS — K0889 Other specified disorders of teeth and supporting structures: Secondary | ICD-10-CM | POA: Diagnosis present

## 2020-06-05 MED ORDER — LIDOCAINE VISCOUS HCL 2 % MT SOLN
15.0000 mL | Freq: Once | OROMUCOSAL | Status: AC
  Administered 2020-06-05: 15 mL via OROMUCOSAL
  Filled 2020-06-05: qty 15

## 2020-06-05 MED ORDER — AMOXICILLIN 500 MG PO CAPS: 500.0000 mg | ORAL_CAPSULE | Freq: Three times a day (TID) | ORAL | 0 refills | Status: AC

## 2020-06-05 MED ORDER — IBUPROFEN 600 MG PO TABS: 600.0000 mg | ORAL_TABLET | Freq: Four times a day (QID) | ORAL | 0 refills | Status: AC | PRN

## 2020-06-05 MED ORDER — LIDOCAINE VISCOUS HCL 2 % MT SOLN: 10.0000 mL | OROMUCOSAL | 0 refills | Status: AC | PRN

## 2020-06-05 MED ORDER — KETOROLAC TROMETHAMINE 30 MG/ML IJ SOLN
30.0000 mg | Freq: Once | INTRAMUSCULAR | Status: AC
  Administered 2020-06-05: 30 mg via INTRAMUSCULAR
  Filled 2020-06-05: qty 1

## 2020-06-05 MED ORDER — AMOXICILLIN 500 MG PO CAPS
500.0000 mg | ORAL_CAPSULE | Freq: Once | ORAL | Status: AC
  Administered 2020-06-05: 500 mg via ORAL
  Filled 2020-06-05: qty 1

## 2020-06-05 NOTE — Discharge Instructions (Signed)
OPTIONS FOR DENTAL FOLLOW UP CARE ° °[place] Department of Health and Human Services - Local Safety Net Dental Clinics °http://www.ncdhhs.gov/dph/oralhealth/services/safetynetclinics.htm °  °[facility] (336-562-3123) ° °[facility] [place] ([phone]) ° °[facility] ([phone] ext [phone]) ° °[facility] ([phone]) ° °[facility] ([phone]) °This clinic caters to the indigent population and is on a lottery system. °Location: °[facility], [address], [place] °Clinic Hours: °Wednesdays from 6pm - 9pm, patients seen by a lottery system. °For dates, call or go to [email or web address].[email or web address]/[facility]/patients/[email or web address] °Services: °Cleanings, fillings and simple extractions. °Payment Options: °DENTAL WORK IS FREE OF CHARGE. Bring proof of income or support. °Best way to get seen: °Arrive at 5:15 pm - this is a lottery, NOT first come/first serve, so arriving earlier will not increase your chances of being seen. °  °  °[facility] °[phone] °Select option 1 for emergencies °  °Location: °[facility], [address], [place] °Clinic Hours: °No walk-ins accepted - call the day before to schedule an appointment. °Check in times are 9:30 am and 1:30 pm. °Services: °Simple extractions, temporary fillings, pulpectomy/pulp debridement, uncomplicated abscess drainage. °Payment Options: °PAYMENT IS DUE AT THE TIME OF SERVICE.  Fee is usually $100-200, additional surgical procedures (e.g. abscess drainage) may be extra. °Cash, checks, Visa/MasterCard accepted.  Can file Medicaid if patient is covered for dental - patient should call case worker to check. °No discount for [facility] patients. °Best way to get seen: °MUST call the day before and get onto the schedule. Can usually be seen the next 1-2 days. No walk-ins accepted. °  °  °[facility] °[phone] °   °Location: °[facility], [address], [place] °Clinic Hours: °M, W, Th, F 8am or 1:30pm, Tues 9a or 1:30 - first come/first served. °Services: °Simple extractions, temporary fillings, uncomplicated abscess drainage.  You do not need to be an [place] resident. °Payment Options: °PAYMENT IS DUE AT THE TIME OF SERVICE. °Dental insurance, otherwise sliding scale - bring proof of income or support. °Depending on income and treatment needed, cost is usually $50-200. °Best way to get seen: °Arrive early as it is first come/first served. °  °  °[facility] °[phone] °  °Location: °[address] °Clinic Hours: °Mon-Thu 8a-5p °Services: °Most basic dental services including extractions and fillings. °Payment Options: °PAYMENT IS DUE AT THE TIME OF SERVICE. °Sliding scale, up to 50% off - bring proof if income or support. °Medicaid with dental option accepted. °Best way to get seen: °Call to schedule an appointment, can usually be seen within 2 weeks OR they will try to see walk-ins - show up at 8a or 2p (you may have to wait). °  °  °[facility] °[phone] °[place] RESIDENTS ONLY °  °Location: °[facility], [address], [place], [place] [address] °Clinic Hours: By appointment only. °Monday - Thursday 8am-5pm, Friday 8am-12pm °Services: Cleanings, fillings, extractions. °Payment Options: °PAYMENT IS DUE AT THE TIME OF SERVICE. °Cash, Visa or MasterCard. Sliding scale - $30 minimum per service. °Best way to get seen: °Come in to office, complete packet and make an appointment - need proof of income °or support monies for each household member and proof of [place] residence. °Usually takes about a month to get in. °  °  °[facility] °[phone] °  °Location: °[address]   [place] °Clinic Hours: Walk-in Urgent Care Dental Services are offered Monday-Friday  mornings only. °The numbers of emergencies accepted daily is limited to the number of °providers available. °Maximum 15 - Mondays, Wednesdays & Thursdays °Maximum 10 - Tuesdays & Fridays °Services: °You do not need to be a [place] resident to be seen for a dental emergency. °Emergencies are defined as pain, swelling, abnormal bleeding, or dental trauma. Walkins will receive x-rays if needed. °NOTE: Dental cleaning is not an emergency. °Payment Options: °PAYMENT IS DUE AT THE TIME OF SERVICE. °Minimum co-pay is $40.00 for uninsured patients. °Minimum co-pay is $3.00 for Medicaid with dental coverage. °Dental Insurance is accepted and must be presented at time of visit. °Medicare does not cover dental. °Forms of payment: Cash, credit card, checks. °Best way to get seen: °If not previously registered with the clinic, walk-in dental registration begins at 7:15 am and is on a first come/first serve basis. °If previously registered with the clinic, call to make an appointment. °  °  °[facility] °[phone] °[place] RESIDENTS ONLY °  °Location: °[address], [place], [place] °Clinic Hours: °Mon-Thu 10a-2p °Services: Extractions only! °Payment Options: °FREE (donations accepted) - bring proof of income or support °Best way to get seen: °Call and schedule an appointment OR come at 8am on the 1st Monday of every month (except for holidays) when it is first come/first served. °  °  °[facility] °[phone] °  °Location: °[address], [place] °Clinic Hours: °Friday mornings °Services, Payment Options, Best way to get seen: °Call for info °

## 2020-06-05 NOTE — ED Provider Notes (Signed)
[facility] Emergency Department Provider Note  ____________________________________________  Time seen: Approximately 11:52 AM  I have reviewed the triage vital signs and the nursing notes.   HISTORY  Chief Complaint Dental Pain    HPI Nicole Kaufman is a 29 y.o. female that presents to the emergency department for evaluation of right upper dental pain for 2 days.  Her gum started swelling this morning.  Patient states that she has had intermittent problems with her teeth.  She does have an appointment with a dentist but due to their schedule, her appointment is not until the end of November.  She took ibuprofen yesterday without improvement of symptoms.  No fevers.  Past Medical History:  Diagnosis Date   Anemia    Medical history non-contributory     Patient Active Problem List   Diagnosis Date Noted   Unwanted fertility    Bacterial vaginosis 03/03/2019    Past Surgical History:  Procedure Laterality Date   LAPAROSCOPIC BILATERAL SALPINGECTOMY Bilateral 12/01/2019   Procedure: LAPAROSCOPIC BILATERAL SALPINGECTOMY;  Surgeon: Vena Austria, MD;  Location: [facility] ORS;  Service: Gynecology;  Laterality: Bilateral;   NO PAST SURGERIES      Prior to Admission medications   Medication Sig Start Date End Date Taking? Authorizing Provider  amoxicillin (AMOXIL) 500 MG capsule Take 1 capsule (500 mg total) by mouth 3 (three) times daily. 06/05/20   Enid Derry, PA-C  ibuprofen (ADVIL) 600 MG tablet Take 1 tablet (600 mg total) by mouth every 6 (six) hours as needed. 06/05/20   Enid Derry, PA-C  levonorgestrel-ethinyl estradiol (LESSINA-28) 0.1-20 MG-MCG tablet TAKE 1 TABLET BY MOUTH EVERY DAY 12/25/19   Vena Austria, MD  lidocaine (XYLOCAINE) 2 % solution Use as directed 10 mLs in the mouth or throat as needed. 06/05/20   Enid Derry, PA-C  Norethindrone-Ethinyl Estradiol-Fe Biphas (LO LOESTRIN FE) 1 MG-10 MCG / 10 MCG tablet Take 1  tablet by mouth daily. 10/09/19 01/01/20  Vena Austria, MD  oxyCODONE-acetaminophen (PERCOCET) 5-325 MG tablet Take 1 tablet by mouth every 4 (four) hours as needed for severe pain. 12/14/19   Vena Austria, MD    Allergies Norco [hydrocodone-acetaminophen]  Family History  Problem Relation Age of Onset   Congestive Heart Failure Mother    Diabetes Mother    Diabetes Father     Social History Social History   Tobacco Use   Smoking status: Never Smoker   Smokeless tobacco: Never Used  Building services engineer Use: Never used  Substance Use Topics   Alcohol use: No   Drug use: No     Review of Systems  Constitutional: No fever/chills ENT: No upper respiratory complaints.  Positive for dental pain. Cardiovascular: No chest pain. Respiratory: No SOB. Gastrointestinal: No nausea, no vomiting.  Musculoskeletal: Negative for musculoskeletal pain. Skin: Negative for rash, abrasions, lacerations, ecchymosis. Neurological: Negative for headaches   ____________________________________________   PHYSICAL EXAM:  VITAL SIGNS: ED Triage Vitals  Enc Vitals Group     BP 06/05/20 1126 (!) 139/96     Pulse Rate 06/05/20 1126 93     Resp 06/05/20 1126 18     Temp 06/05/20 1126 99.5 F (37.5 C)     Temp Source 06/05/20 1126 Oral     SpO2 06/05/20 1126 98 %     Weight 06/05/20 1125 145 lb (65.8 kg)     Height 06/05/20 1125 5\' 5"  (1.651 m)     Head Circumference --  Peak Flow --      Pain Score 06/05/20 1125 8     Pain Loc --      Pain Edu? --      Excl. in GC? --      Constitutional: Alert and oriented. Well appearing and in no acute distress. Eyes: Conjunctivae are normal. PERRL. EOMI. Head: Atraumatic. ENT:      Ears:      Nose: No congestion/rhinnorhea.      Mouth/Throat: Mucous membranes are moist.  Multiple cavities.  Tenderness to palpation to right upper canine.  Tender to palpation to right cheek proximal to the right nares.  Minimal swelling.   Full range of motion of jaw. Neck: No stridor.  Cardiovascular: Normal rate, regular rhythm.  Good peripheral circulation. Respiratory: Normal respiratory effort without tachypnea or retractions. Lungs CTAB. Good air entry to the bases with no decreased or absent breath sounds. Musculoskeletal: Full range of motion to all extremities. No gross deformities appreciated. Neurologic:  Normal speech and language. No gross focal neurologic deficits are appreciated.  Skin:  Skin is warm, dry and intact. No rash noted. Psychiatric: Mood and affect are normal. Speech and behavior are normal. Patient exhibits appropriate insight and judgement.   ____________________________________________   LABS (all labs ordered are listed, but only abnormal results are displayed)  Labs Reviewed - No data to display ____________________________________________  EKG   ____________________________________________  RADIOLOGY   No results found.  ____________________________________________    PROCEDURES  Procedure(s) performed:    Procedures    Medications  ketorolac (TORADOL) 30 MG/ML injection 30 mg (30 mg Intramuscular Given 06/05/20 1146)  lidocaine (XYLOCAINE) 2 % viscous mouth solution 15 mL (15 mLs Mouth/Throat Given 06/05/20 1147)  amoxicillin (AMOXIL) capsule 500 mg (500 mg Oral Given 06/05/20 1147)     ____________________________________________   INITIAL IMPRESSION / ASSESSMENT AND PLAN / ED COURSE  Pertinent labs & imaging results that were available during my care of the patient were reviewed by me and considered in my medical decision making (see chart for details).  Review of the [place] CSRS was performed in accordance of the [facility] prior to dispensing any controlled drugs.     Patient's diagnosis is consistent with dental abscess.  Patient was given IM Toradol and viscous lidocaine in the emergency department for pain.  Patient will be discharged home with prescriptions for  amoxicillin. Patient is to follow up with dentist as directed. Patient is given ED precautions to return to the ED for any worsening or new symptoms.   Nicole Kaufman was evaluated in Emergency Department on 06/05/2020 for the symptoms described in the history of present illness. She was evaluated in the context of the global COVID-19 pandemic, which necessitated consideration that the patient might be at risk for infection with the SARS-CoV-2 virus that causes COVID-19. Institutional protocols and algorithms that pertain to the evaluation of patients at risk for COVID-19 are in a state of rapid change based on information released by regulatory bodies including the CDC and federal and state organizations. These policies and algorithms were followed during the patient's care in the ED.  ____________________________________________  FINAL CLINICAL IMPRESSION(S) / ED DIAGNOSES  Final diagnoses:  Dental abscess      NEW MEDICATIONS STARTED DURING THIS VISIT:  ED Discharge Orders         Ordered    amoxicillin (AMOXIL) 500 MG capsule  3 times daily        06/05/20 1155    ibuprofen (  ADVIL) 600 MG tablet  Every 6 hours PRN        06/05/20 1155    lidocaine (XYLOCAINE) 2 % solution  As needed        06/05/20 1155              This chart was dictated using voice recognition software/Dragon. Despite best efforts to proofread, errors can occur which can change the meaning. Any change was purely unintentional.    Enid Derry, PA-C 06/05/20 1359    Sharyn Creamer, MD 06/05/20 [phone]

## 2020-06-05 NOTE — ED Triage Notes (Signed)
Pt comes pov with dental pain on right side and some swelling. Started yesterday.

## 2020-06-08 ENCOUNTER — Other Ambulatory Visit: Payer: Self-pay

## 2020-06-08 DIAGNOSIS — K029 Dental caries, unspecified: Secondary | ICD-10-CM | POA: Diagnosis present

## 2020-06-08 DIAGNOSIS — D649 Anemia, unspecified: Secondary | ICD-10-CM | POA: Diagnosis not present

## 2020-06-08 DIAGNOSIS — L03211 Cellulitis of face: Secondary | ICD-10-CM | POA: Diagnosis present

## 2020-06-08 DIAGNOSIS — R000 Tachycardia, unspecified: Secondary | ICD-10-CM | POA: Diagnosis present

## 2020-06-08 DIAGNOSIS — Z793 Long term (current) use of hormonal contraceptives: Secondary | ICD-10-CM | POA: Diagnosis not present

## 2020-06-08 DIAGNOSIS — Z20822 Contact with and (suspected) exposure to covid-19: Secondary | ICD-10-CM | POA: Diagnosis present

## 2020-06-08 DIAGNOSIS — J320 Chronic maxillary sinusitis: Secondary | ICD-10-CM | POA: Diagnosis not present

## 2020-06-08 DIAGNOSIS — Z9079 Acquired absence of other genital organ(s): Secondary | ICD-10-CM | POA: Diagnosis not present

## 2020-06-08 DIAGNOSIS — Z79899 Other long term (current) drug therapy: Secondary | ICD-10-CM | POA: Diagnosis not present

## 2020-06-08 DIAGNOSIS — Z90722 Acquired absence of ovaries, bilateral: Secondary | ICD-10-CM | POA: Diagnosis not present

## 2020-06-08 DIAGNOSIS — Z885 Allergy status to narcotic agent status: Secondary | ICD-10-CM

## 2020-06-08 NOTE — ED Triage Notes (Signed)
Pt returned to ED with worsening right sided facial swelling. Pt was seen on 10/31 and prescribed antibiotics that she has been taking as prescribed but the swelling continues to worsen. Pt now has swelling to her nose and under her right eye. Pt took benadryl at home without relief as well. No fevers at home. No voice changes or swelling in throat.

## 2020-06-09 ENCOUNTER — Observation Stay
Admission: EM | Admit: 2020-06-09 | Discharge: 2020-06-09 | DRG: 603 | Discharge status: Against Medical Advice | Payer: Medicaid Other | Attending: Internal Medicine | Admitting: Internal Medicine

## 2020-06-09 ENCOUNTER — Emergency Department: Payer: Medicaid Other

## 2020-06-09 DIAGNOSIS — Z79899 Other long term (current) drug therapy: Secondary | ICD-10-CM | POA: Diagnosis not present

## 2020-06-09 DIAGNOSIS — Z793 Long term (current) use of hormonal contraceptives: Secondary | ICD-10-CM | POA: Diagnosis not present

## 2020-06-09 DIAGNOSIS — Z20822 Contact with and (suspected) exposure to covid-19: Secondary | ICD-10-CM | POA: Diagnosis present

## 2020-06-09 DIAGNOSIS — L03211 Cellulitis of face: Secondary | ICD-10-CM | POA: Diagnosis not present

## 2020-06-09 DIAGNOSIS — D649 Anemia, unspecified: Secondary | ICD-10-CM | POA: Diagnosis present

## 2020-06-09 DIAGNOSIS — K029 Dental caries, unspecified: Secondary | ICD-10-CM | POA: Diagnosis present

## 2020-06-09 DIAGNOSIS — Z90722 Acquired absence of ovaries, bilateral: Secondary | ICD-10-CM | POA: Diagnosis not present

## 2020-06-09 DIAGNOSIS — J320 Chronic maxillary sinusitis: Secondary | ICD-10-CM | POA: Diagnosis present

## 2020-06-09 DIAGNOSIS — Z885 Allergy status to narcotic agent status: Secondary | ICD-10-CM | POA: Diagnosis not present

## 2020-06-09 DIAGNOSIS — R000 Tachycardia, unspecified: Secondary | ICD-10-CM | POA: Diagnosis present

## 2020-06-09 DIAGNOSIS — Z9079 Acquired absence of other genital organ(s): Secondary | ICD-10-CM | POA: Diagnosis not present

## 2020-06-09 LAB — CBC WITH DIFFERENTIAL/PLATELET
Abs Immature Granulocytes: 0.02 10*3/uL (ref 0.00–0.07)
Basophils Absolute: 0 10*3/uL (ref 0.0–0.1)
Basophils Relative: 1 %
Eosinophils Absolute: 0.3 10*3/uL (ref 0.0–0.5)
Eosinophils Relative: 3 %
HCT: 40.9 % (ref 36.0–46.0)
Hemoglobin: 12.6 g/dL (ref 12.0–15.0)
Immature Granulocytes: 0 %
Lymphocytes Relative: 35 %
Lymphs Abs: 2.9 10*3/uL (ref 0.7–4.0)
MCH: 24.3 pg — ABNORMAL LOW (ref 26.0–34.0)
MCHC: 30.8 g/dL (ref 30.0–36.0)
MCV: 78.8 fL — ABNORMAL LOW (ref 80.0–100.0)
Monocytes Absolute: 0.6 10*3/uL (ref 0.1–1.0)
Monocytes Relative: 7 %
Neutro Abs: 4.6 10*3/uL (ref 1.7–7.7)
Neutrophils Relative %: 54 %
Platelets: 188 10*3/uL (ref 150–400)
RBC: 5.19 MIL/uL — ABNORMAL HIGH (ref 3.87–5.11)
RDW: 13.6 % (ref 11.5–15.5)
WBC: 8.4 10*3/uL (ref 4.0–10.5)
nRBC: 0 % (ref 0.0–0.2)

## 2020-06-09 LAB — CBC
HCT: 39.6 % (ref 36.0–46.0)
Hemoglobin: 12.3 g/dL (ref 12.0–15.0)
MCH: 24.1 pg — ABNORMAL LOW (ref 26.0–34.0)
MCHC: 31.1 g/dL (ref 30.0–36.0)
MCV: 77.6 fL — ABNORMAL LOW (ref 80.0–100.0)
Platelets: 194 10*3/uL (ref 150–400)
RBC: 5.1 MIL/uL (ref 3.87–5.11)
RDW: 13.3 % (ref 11.5–15.5)
WBC: 8.8 10*3/uL (ref 4.0–10.5)
nRBC: 0 % (ref 0.0–0.2)

## 2020-06-09 LAB — BASIC METABOLIC PANEL
Anion gap: 8 (ref 5–15)
Anion gap: 10 (ref 5–15)
BUN: 5 mg/dL — ABNORMAL LOW (ref 6–20)
BUN: <5 mg/dL — ABNORMAL LOW (ref 6–20)
CO2: 27 mmol/L (ref 22–32)
CO2: 26 mmol/L (ref 22–32)
Calcium: 8.9 mg/dL (ref 8.9–10.3)
Calcium: 8.8 mg/dL — ABNORMAL LOW (ref 8.9–10.3)
Chloride: 102 mmol/L (ref 98–111)
Chloride: 103 mmol/L (ref 98–111)
Creatinine, Ser: 0.81 mg/dL (ref 0.44–1.00)
Creatinine, Ser: 0.66 mg/dL (ref 0.44–1.00)
GFR, Estimated: >60 mL/min (ref >60)
GFR, Estimated: >60 mL/min (ref >60)
Glucose, Bld: 101 mg/dL — ABNORMAL HIGH (ref 70–99)
Glucose, Bld: 101 mg/dL — ABNORMAL HIGH (ref 70–99)
Potassium: 3.5 mmol/L (ref 3.5–5.1)
Potassium: 3.4 mmol/L — ABNORMAL LOW (ref 3.5–5.1)
Sodium: 137 mmol/L (ref 135–145)
Sodium: 139 mmol/L (ref 135–145)

## 2020-06-09 LAB — HIV ANTIBODY (ROUTINE TESTING W REFLEX): HIV Screen 4th Generation wRfx: NONREACTIVE

## 2020-06-09 LAB — SEDIMENTATION RATE: Sed Rate: 17 mm/hr (ref 0–20)

## 2020-06-09 LAB — RESPIRATORY PANEL BY RT PCR (FLU A&B, COVID)
Influenza A by PCR: NEGATIVE
Influenza B by PCR: NEGATIVE
SARS Coronavirus 2 by RT PCR: NEGATIVE

## 2020-06-09 LAB — LACTIC ACID, PLASMA: Lactic Acid, Venous: 1 mmol/L (ref 0.5–1.9)

## 2020-06-09 LAB — C-REACTIVE PROTEIN: CRP: 4.7 mg/dL — ABNORMAL HIGH (ref <1.0)

## 2020-06-09 MED ORDER — ENOXAPARIN SODIUM 40 MG/0.4ML ~~LOC~~ SOLN
40.0000 mg | SUBCUTANEOUS | Status: DC
  Filled 2020-06-09: qty 0.4

## 2020-06-09 MED ORDER — IBUPROFEN 400 MG PO TABS: 400.0000 mg | ORAL_TABLET | Freq: Four times a day (QID) | ORAL | Status: DC | PRN

## 2020-06-09 MED ORDER — BISACODYL 10 MG RE SUPP
10.0000 mg | Freq: Every day | RECTAL | Status: DC | PRN
  Filled 2020-06-09: qty 1

## 2020-06-09 MED ORDER — ONDANSETRON HCL 4 MG/2ML IJ SOLN: 4.0000 mg | Freq: Four times a day (QID) | INTRAMUSCULAR | Status: DC | PRN

## 2020-06-09 MED ORDER — ONDANSETRON HCL 4 MG PO TABS: 4.0000 mg | ORAL_TABLET | Freq: Four times a day (QID) | ORAL | Status: DC | PRN

## 2020-06-09 MED ORDER — OXYCODONE-ACETAMINOPHEN 5-325 MG PO TABS: 1.0000 | ORAL_TABLET | Freq: Once | ORAL | Status: DC

## 2020-06-09 MED ORDER — MAGNESIUM HYDROXIDE 400 MG/5ML PO SUSP: 30.0000 mL | Freq: Every day | ORAL | Status: DC | PRN

## 2020-06-09 MED ORDER — SODIUM CHLORIDE 0.9 % IV SOLN: INTRAVENOUS | Status: DC

## 2020-06-09 MED ORDER — OXYCODONE-ACETAMINOPHEN 5-325 MG PO TABS
1.0000 | ORAL_TABLET | Freq: Four times a day (QID) | ORAL | Status: DC | PRN
  Administered 2020-06-09: 1 via ORAL
  Filled 2020-06-09: qty 1

## 2020-06-09 MED ORDER — OXYCODONE-ACETAMINOPHEN 5-325 MG PO TABS
ORAL_TABLET | ORAL | Status: AC
  Administered 2020-06-09: 1 via ORAL
  Filled 2020-06-09: qty 1

## 2020-06-09 MED ORDER — ONDANSETRON HCL 4 MG/2ML IJ SOLN
4.0000 mg | Freq: Once | INTRAMUSCULAR | Status: DC
  Filled 2020-06-09: qty 2

## 2020-06-09 MED ORDER — CLINDAMYCIN PHOSPHATE 600 MG/50ML IV SOLN
600.0000 mg | Freq: Once | INTRAVENOUS | Status: DC
  Filled 2020-06-09: qty 50

## 2020-06-09 MED ORDER — IOHEXOL 300 MG/ML  SOLN
75.0000 mL | Freq: Once | INTRAMUSCULAR | Status: AC | PRN
  Administered 2020-06-09: 75 mL via INTRAVENOUS

## 2020-06-09 MED ORDER — TRAZODONE HCL 50 MG PO TABS: 25.0000 mg | ORAL_TABLET | Freq: Every evening | ORAL | Status: DC | PRN

## 2020-06-09 MED ORDER — MORPHINE SULFATE (PF) 2 MG/ML IV SOLN
2.0000 mg | Freq: Once | INTRAVENOUS | Status: DC
  Filled 2020-06-09: qty 1

## 2020-06-09 MED ORDER — OXYCODONE-ACETAMINOPHEN 5-325 MG PO TABS: 1.0000 | ORAL_TABLET | Freq: Once | ORAL | Status: AC

## 2020-06-09 MED ORDER — CLINDAMYCIN PHOSPHATE 600 MG/50ML IV SOLN
600.0000 mg | Freq: Three times a day (TID) | INTRAVENOUS | Status: DC
  Administered 2020-06-09: 600 mg via INTRAVENOUS
  Filled 2020-06-09 (×2): qty 50

## 2020-06-09 NOTE — ED Notes (Signed)
MD at bedside. 

## 2020-06-09 NOTE — ED Notes (Signed)
Pt given graham crackers and soda.

## 2020-06-09 NOTE — ED Provider Notes (Signed)
[facility] Emergency Department Provider Note  ____________________________________________   First MD Initiated Contact with Patient 06/09/20 0407     (approximate)  I have reviewed the triage vital signs and the nursing notes.   HISTORY  Chief Complaint Facial Swelling    HPI Nicole Kaufman is a 29 y.o. female with below list of previous medical conditions including recently diagnosed possible dental abscess on 06/05/2020 presents to the emergency department secondary to worsening right facial swelling and discomfort.  Patient was prescribed amoxicillin on 06/05/2020 which she states that she has been compliant with taking however swelling has worsened as well as discomfort.  Patient denies any difficulty breathing or swallowing.  Patient does admit to subjective fevers       Past Medical History:  Diagnosis Date  . Anemia   . Medical history non-contributory     Patient Active Problem List   Diagnosis Date Noted  . Unwanted fertility   . Bacterial vaginosis 03/03/2019    Past Surgical History:  Procedure Laterality Date  . LAPAROSCOPIC BILATERAL SALPINGECTOMY Bilateral 12/01/2019   Procedure: LAPAROSCOPIC BILATERAL SALPINGECTOMY;  Surgeon: Vena Austria, MD;  Location: [facility] ORS;  Service: Gynecology;  Laterality: Bilateral;  . NO PAST SURGERIES      Prior to Admission medications   Medication Sig Start Date End Date Taking? Authorizing Provider  amoxicillin (AMOXIL) 500 MG capsule Take 1 capsule (500 mg total) by mouth 3 (three) times daily. 06/05/20   Enid Derry, PA-C  ibuprofen (ADVIL) 600 MG tablet Take 1 tablet (600 mg total) by mouth every 6 (six) hours as needed. 06/05/20   Enid Derry, PA-C  levonorgestrel-ethinyl estradiol (LESSINA-28) 0.1-20 MG-MCG tablet TAKE 1 TABLET BY MOUTH EVERY DAY 12/25/19   Vena Austria, MD  lidocaine (XYLOCAINE) 2 % solution Use as directed 10 mLs in the mouth or throat as needed.  06/05/20   Enid Derry, PA-C  Norethindrone-Ethinyl Estradiol-Fe Biphas (LO LOESTRIN FE) 1 MG-10 MCG / 10 MCG tablet Take 1 tablet by mouth daily. 10/09/19 01/01/20  Vena Austria, MD  oxyCODONE-acetaminophen (PERCOCET) 5-325 MG tablet Take 1 tablet by mouth every 4 (four) hours as needed for severe pain. 12/14/19   Vena Austria, MD    Allergies Norco [hydrocodone-acetaminophen]  Family History  Problem Relation Age of Onset  . Congestive Heart Failure Mother   . Diabetes Mother   . Diabetes Father     Social History Social History   Tobacco Use  . Smoking status: Never Smoker  . Smokeless tobacco: Never Used  Vaping Use  . Vaping Use: Never used  Substance Use Topics  . Alcohol use: No  . Drug use: No    Review of Systems Constitutional: Positive for fever/chills Eyes: No visual changes. ENT: No sore throat.  Positive for right facial swelling Cardiovascular: Denies chest pain. Respiratory: Denies shortness of breath. Gastrointestinal: No abdominal pain.  No nausea, no vomiting.  No diarrhea.  No constipation. Genitourinary: Negative for dysuria. Musculoskeletal: Negative for neck pain.  Negative for back pain. Integumentary: Negative for rash. Neurological: Negative for headaches, focal weakness or numbness.   ____________________________________________   PHYSICAL EXAM:  VITAL SIGNS: ED Triage Vitals  Enc Vitals Group     BP 06/08/20 2342 (!) 132/94     Pulse Rate 06/08/20 2342 96     Resp 06/08/20 2342 16     Temp 06/08/20 2342 99.8 F (37.7 C)     Temp Source 06/08/20 2342 Oral     SpO2  06/08/20 2342 98 %     Weight 06/08/20 2343 65.8 kg (145 lb 1 oz)     Height 06/08/20 2343 1.651 m (5\' 5" )     Head Circumference --      Peak Flow --      Pain Score 06/08/20 2343 10     Pain Loc --      Pain Edu? --      Excl. in GC? --     Constitutional: Alert and oriented.  Eyes: Conjunctivae are normal.  Head: Facial swelling extending from the  angle of the mandible to the periorbital region on the right. Mouth/Throat: Multiple dental caries noted without discrete abscess Neck: No stridor.  No meningeal signs.   Cardiovascular: Normal rate, regular rhythm. Good peripheral circulation. Grossly normal heart sounds. Respiratory: Normal respiratory effort.  No retractions. Gastrointestinal: Soft and nontender. No distention.   Musculoskeletal: No lower extremity tenderness nor edema. No gross deformities of extremities. Neurologic:  Normal speech and language. No gross focal neurologic deficits are appreciated.  Skin:  Skin is warm, dry and intact. Psychiatric: Mood and affect are normal. Speech and behavior are normal.  ____________________________________________   LABS (all labs ordered are listed, but only abnormal results are displayed)  Labs Reviewed  CBC WITH DIFFERENTIAL/PLATELET - Abnormal; Notable for the following components:      Result Value   RBC 5.19 (*)    MCV 78.8 (*)    MCH 24.3 (*)    All other components within normal limits  BASIC METABOLIC PANEL - Abnormal; Notable for the following components:   Glucose, Bld 101 (*)    BUN 5 (*)    All other components within normal limits  CULTURE, BLOOD (ROUTINE X 2)  CULTURE, BLOOD (ROUTINE X 2)  LACTIC ACID, PLASMA   ______  RADIOLOGY I, [place] Shawnda Mauney, personally viewed and evaluated these images (plain radiographs) as part of my medical decision making, as well as reviewing the written report by the radiologist.  ED MD interpretation: CT maxillofacial revealed asymmetric soft tissue swelling with inflammatory stranding involving the right face suspicious for acute cellulitis. Official radiology report(s): CT Maxillofacial W Contrast  Result Date: 06/09/2020 CLINICAL DATA:  Initial evaluation for acute right facial swelling, cellulitis. EXAM: CT MAXILLOFACIAL WITH CONTRAST TECHNIQUE: Multidetector CT imaging of the maxillofacial structures was performed with  intravenous contrast. Multiplanar CT image reconstructions were also generated. CONTRAST:  54mL OMNIPAQUE IOHEXOL 300 MG/ML  SOLN COMPARISON:  None. FINDINGS: Osseous: No acute osseous abnormality. Orbits: Globes and orbital soft tissues within normal limits. No evidence for intraorbital or postseptal cellulitis. Sinuses: Moderate mucosal thickening noted within the ethmoidal air cells and right maxillary sinus. Paranasal sinuses are otherwise largely clear. Visualize mastoids and middle ear cavities are clear as well. Soft tissues: Asymmetric soft tissue swelling with inflammatory stranding seen involving the right face, primarily involving the right masticator space. Extension to involve the right nasal labial fold. Findings suspicious for acute infection/cellulitis. There are innumerable dental caries, raising the possibility for an odontogenic source. However, the presence of right maxillary sinus disease could also be considered as a potential source as well. Retro antral fat maintained at this time, with no extension of inflammatory changes into the deeper spaces of the face/neck. No discrete abscess or drainable fluid collection. Few prominent right level 1 B nodes measuring up to 1 cm noted, presumably reactive. Limited intracranial: Unremarkable. IMPRESSION: 1. Asymmetric soft tissue swelling with inflammatory stranding involving the right face, suspicious for  acute infection/cellulitis. No discrete abscess or drainable fluid collection. 2. Poor dentition with innumerable dental caries, raising the possibility for an odontogenic source. However, there is also evidence for right maxillary sinus disease, which could also be considered as a potential source of infection as well. Appropriate tailoring of antibiotic regimen recommended. Electronically Signed   By: Rise Mu M.D.   On: 06/09/2020 02:00      Procedures   ____________________________________________   INITIAL IMPRESSION / MDM  / ASSESSMENT AND PLAN / ED COURSE  As part of my medical decision making, I reviewed the following data within the electronic MEDICAL RECORD NUMBER   29 year old female presented with above-stated history and physical exam concerning for right facial cellulitis with CT findings consistent with the same.  CT also revealed evidence of possible right maxillary sinusitis.  Patient given clindamycin 600 mg IV.  Patient discussed with Dr. Arville Care for hospital admission for further evaluation and management.  ____________________________________________  FINAL CLINICAL IMPRESSION(S) / ED DIAGNOSES  Final diagnoses:  Facial cellulitis     MEDICATIONS GIVEN DURING THIS VISIT:  Medications  clindamycin (CLEOCIN) IVPB 600 mg (has no administration in time range)  morphine 2 MG/ML injection 2 mg (has no administration in time range)  ondansetron (ZOFRAN) injection 4 mg (has no administration in time range)  oxyCODONE-acetaminophen (PERCOCET/ROXICET) 5-325 MG per tablet 1 tablet (1 tablet Oral Given 06/09/20 0039)  iohexol (OMNIPAQUE) 300 MG/ML solution 75 mL (75 mLs Intravenous Contrast Given 06/09/20 0136)     ED Discharge Orders    None      *Please note:  Nicole Kaufman was evaluated in Emergency Department on 06/09/2020 for the symptoms described in the history of present illness. She was evaluated in the context of the global COVID-19 pandemic, which necessitated consideration that the patient might be at risk for infection with the SARS-CoV-2 virus that causes COVID-19. Institutional protocols and algorithms that pertain to the evaluation of patients at risk for COVID-19 are in a state of rapid change based on information released by regulatory bodies including the CDC and federal and state organizations. These policies and algorithms were followed during the patient's care in the ED.  Some ED evaluations and interventions may be delayed as a result of limited staffing during and after the  pandemic.*  Note:  This document was prepared using Dragon voice recognition software and may include unintentional dictation errors.   Darci Current, MD 06/09/20 [phone]

## 2020-06-09 NOTE — Discharge Instructions (Signed)

## 2020-06-09 NOTE — ED Notes (Addendum)
Dr. Arville Care at bedside to assess patient. Updated that pt would be back to ED treatment room as soon as possible.

## 2020-06-09 NOTE — ED Notes (Signed)
Attempted to call patient without answer.

## 2020-06-09 NOTE — Discharge Summary (Signed)
Physician Discharge Summary  Nicole Kaufman QRF:[id number] DOB: 1990-09-20 DOA: 06/09/2020  PCP: [place] date: 06/09/2020 Discharge date: 06/09/2020  Recommendations for Outpatient Follow-up:  -none since pt left on [place]: none Equipment/Devices: none    Discharge Condition: stable CODE STATUS: full Diet recommendation:   Brief/Interim Summary (HPI)  Nicole Kaufman is a 29 y.o. female with medical history significant of anemia, who presents with rectal facial pain and swelling.  Patient states that her symptoms started 5 days ago, including right facial pain and swelling, which has been progressively worsening. Patient was seen in the ED on 10/31, and was started on amoxicillin. Patient is taking antibiotics without improvement. She states that her symptom has been progressively worsening. Currently the pain is constant, sharp, 10 out of 10 severity, nonradiating. Patient denies fever or chills. No chest pain, shortness breath, cough. No nausea vomiting, diarrhea or abdominal pain. No symptoms of UTI or unilateral weakness.  ED Course: pt was found to have WBC 8.4, lactic acid 1.0, pending COVID-19 PCR, electrolytes renal function okay, temperature 99.8, blood pressure 132/94, heart rate 96, RR 16. Patient is admitted to [place] bed as inpatient by accepting MD.  CT-maxillofacial 1. Asymmetric soft tissue swelling with inflammatory stranding involving the right face, suspicious for acute infection/cellulitis. No discrete abscess or drainable fluid collection. 2. Poor dentition with innumerable dental caries, raising the possibility for an odontogenic source. However, there is also evidence for right maxillary sinus disease, which could also be considered as a potential source of infection as well. Appropriate tailoring of antibiotic regimen recommended.   Subjective  -right facial swelling and pain  Discharge Diagnoses and Hospital  Course:   Principal Problem:   Facial cellulitis   Facial cellulitis-right: Patient failed outpatient oral antibiotic treatment. CT scan did not show evidence of abscess, but showed poor dentition with innumerable dental caries and possible right maxillary sinusitis, which are likely the source of infection. Patient has tachycardia, but no fever or leukocytosis. Currently not septic. Hemodynamically stable.  Assessment and plan was made as below, unfortunately patient left hospital AMA.  - Admitted to [facility] bed as inpatient by accepting MD - Empiric antimicrobial treatment with IV clindamycin - PRN Zofran for nausea, and Percocet and ibuprofen for pain - Blood cultures x 2  - ESR and CRP    Discharge Instructions   Allergies as of 06/09/2020      Reactions   Norco [hydrocodone-acetaminophen] Nausea And Vomiting    Med Rec must be completed prior to using this [place]       Allergies  Allergen Reactions   Norco [Hydrocodone-Acetaminophen] Nausea And Vomiting    Consultations:  none   Procedures/Studies: CT Maxillofacial W Contrast  Result Date: 06/09/2020 CLINICAL DATA:  Initial evaluation for acute right facial swelling, cellulitis. EXAM: CT MAXILLOFACIAL WITH CONTRAST TECHNIQUE: Multidetector CT imaging of the maxillofacial structures was performed with intravenous contrast. Multiplanar CT image reconstructions were also generated. CONTRAST:  63m OMNIPAQUE IOHEXOL 300 MG/ML  SOLN COMPARISON:  None. FINDINGS: Osseous: No acute osseous abnormality. Orbits: Globes and orbital soft tissues within normal limits. No evidence for intraorbital or postseptal cellulitis. Sinuses: Moderate mucosal thickening noted within the ethmoidal air cells and right maxillary sinus. Paranasal sinuses are otherwise largely clear. Visualize mastoids and middle ear cavities are clear as well. Soft tissues: Asymmetric soft tissue swelling with inflammatory stranding seen involving the right  face, primarily involving the right masticator space. Extension to involve the  right nasal labial fold. Findings suspicious for acute infection/cellulitis. There are innumerable dental caries, raising the possibility for an odontogenic source. However, the presence of right maxillary sinus disease could also be considered as a potential source as well. Retro antral fat maintained at this time, with no extension of inflammatory changes into the deeper spaces of the face/neck. No discrete abscess or drainable fluid collection. Few prominent right level 1 B nodes measuring up to 1 cm noted, presumably reactive. Limited intracranial: Unremarkable. IMPRESSION: 1. Asymmetric soft tissue swelling with inflammatory stranding involving the right face, suspicious for acute infection/cellulitis. No discrete abscess or drainable fluid collection. 2. Poor dentition with innumerable dental caries, raising the possibility for an odontogenic source. However, there is also evidence for right maxillary sinus disease, which could also be considered as a potential source of infection as well. Appropriate tailoring of antibiotic regimen recommended. Electronically Signed   By: Jeannine Boga M.D.   On: 06/09/2020 02:00      Discharge Exam: Vitals:   06/09/20 0840 06/09/20 1117  BP: (!) 135/97 (!) 132/93  Pulse: 80 88  Resp: 16 16  Temp: (!) 97.5 F (36.4 C) 97.6 F (36.4 C)  SpO2: 100% 100%   Vitals:   06/09/20 0730 06/09/20 0756 06/09/20 0840 06/09/20 1117  BP: (!) 124/91 126/81 (!) 135/97 (!) 132/93  Pulse:  86 80 88  Resp:  16 16 16   Temp:  98.3 F (36.8 C) (!) 97.5 F (36.4 C) 97.6 F (36.4 C)  TempSrc:  Oral Oral Oral  SpO2:  98% 100% 100%  Weight:      Height:       Physical examination was not performed, since patient left hospital AMA, I did not have chance to see patient   The results of significant diagnostics from this hospitalization (including imaging, microbiology, ancillary and  laboratory) are listed below for reference.     Microbiology: Recent Results (from the past 240 hour(s))  Culture, blood (routine x 2)     Status: None (Preliminary result)   Collection Time: 06/09/20 12:36 AM   Specimen: BLOOD  Result Value Ref Range Status   Specimen Description BLOOD RIGHT ANTECUBITAL  Final   Special Requests   Final    BOTTLES DRAWN AEROBIC AND ANAEROBIC Blood Culture adequate volume   Culture   Final    NO GROWTH < 12 HOURS Performed at [facility], [address] [place], [place] [address]    Report Status PENDING  Incomplete  Culture, blood (routine x 2)     Status: None (Preliminary result)   Collection Time: 06/09/20 12:36 AM   Specimen: BLOOD  Result Value Ref Range Status   Specimen Description BLOOD BLOOD RIGHT ARM  Final   Special Requests   Final    BOTTLES DRAWN AEROBIC AND ANAEROBIC Blood Culture adequate volume   Culture   Final    NO GROWTH < 12 HOURS Performed at [facility], [address], [place], [place] [address]    Report Status PENDING  Incomplete  Respiratory Panel by RT PCR (Flu A&B, Covid) - Nasopharyngeal Swab     Status: None   Collection Time: 06/09/20  8:13 AM   Specimen: Nasopharyngeal Swab  Result Value Ref Range Status   SARS Coronavirus 2 by RT PCR NEGATIVE NEGATIVE Final    Comment: (NOTE) SARS-CoV-2 target nucleic acids are NOT DETECTED.  The SARS-CoV-2 RNA is generally detectable in upper respiratoy specimens during the acute phase of infection.  The lowest concentration of SARS-CoV-2 viral copies this assay can detect is 131 copies/mL. A negative result does not preclude SARS-Cov-2 infection and should not be used as the sole basis for treatment or other patient management decisions. A negative result may occur with  improper specimen collection/handling, submission of specimen other than nasopharyngeal swab, presence of viral mutation(s) within the areas targeted by this assay, and  inadequate number of viral copies (<131 copies/mL). A negative result must be combined with clinical observations, patient history, and epidemiological information. The expected result is Negative.  Fact Sheet for Patients:  PinkCheek.be  Fact Sheet for Healthcare Providers:  GravelBags.it  This test is no t yet approved or cleared by the [place] FDA and  has been authorized for detection and/or diagnosis of SARS-CoV-2 by FDA under an Emergency Use Authorization (EUA). This EUA will remain  in effect (meaning this test can be used) for the duration of the COVID-19 declaration under Section 564(b)(1) of the Act, 21 U.S.C. section 360bbb-3(b)(1), unless the authorization is terminated or revoked sooner.     Influenza A by PCR NEGATIVE NEGATIVE Final   Influenza B by PCR NEGATIVE NEGATIVE Final    Comment: (NOTE) The Xpert Xpress SARS-CoV-2/FLU/RSV assay is intended as an aid in  the diagnosis of influenza from Nasopharyngeal swab specimens and  should not be used as a sole basis for treatment. Nasal washings and  aspirates are unacceptable for Xpert Xpress SARS-CoV-2/FLU/RSV  testing.  Fact Sheet for Patients: PinkCheek.be  Fact Sheet for Healthcare Providers: GravelBags.it  This test is not yet approved or cleared by the [place] FDA and  has been authorized for detection and/or diagnosis of SARS-CoV-2 by  FDA under an Emergency Use Authorization (EUA). This EUA will remain  in effect (meaning this test can be used) for the duration of the  Covid-19 declaration under Section 564(b)(1) of the Act, 21  U.S.C. section 360bbb-3(b)(1), unless the authorization is  terminated or revoked. Performed at [facility], [address] [place], [place] [address]      Labs: BNP (last 3 results) No results for input(s): BNP in the last 8760  hours. Basic Metabolic Panel: Recent Labs  Lab 06/09/20 0036 06/09/20 0500  NA 137 139  K 3.5 3.4*  CL 102 103  CO2 27 26  GLUCOSE 101* 101*  BUN 5* <5*  CREATININE 0.81 0.66  CALCIUM 8.9 8.8*   Liver Function Tests: No results for input(s): AST, ALT, ALKPHOS, BILITOT, PROT, ALBUMIN in the last 168 hours. No results for input(s): LIPASE, AMYLASE in the last 168 hours. No results for input(s): AMMONIA in the last 168 hours. CBC: Recent Labs  Lab 06/09/20 0036 06/09/20 0500  WBC 8.4 8.8  NEUTROABS 4.6  --   HGB 12.6 12.3  HCT 40.9 39.6  MCV 78.8* 77.6*  PLT 188 194   Cardiac Enzymes: No results for input(s): CKTOTAL, CKMB, CKMBINDEX, TROPONINI in the last 168 hours. BNP: Invalid input(s): POCBNP CBG: No results for input(s): GLUCAP in the last 168 hours. D-Dimer No results for input(s): DDIMER in the last 72 hours. Hgb A1c No results for input(s): HGBA1C in the last 72 hours. Lipid Profile No results for input(s): CHOL, HDL, LDLCALC, TRIG, CHOLHDL, LDLDIRECT in the last 72 hours. Thyroid function studies No results for input(s): TSH, T4TOTAL, T3FREE, THYROIDAB in the last 72 hours.  Invalid input(s): FREET3 Anemia work up No results for input(s): VITAMINB12, FOLATE, FERRITIN, TIBC, IRON, RETICCTPCT in the last 72 hours.  Urinalysis    Component Value Date/Time   GLUCOSEU Negative 12/01/2018 1530   Sepsis Labs Invalid input(s): PROCALCITONIN,  WBC,  LACTICIDVEN Microbiology Recent Results (from the past 240 hour(s))  Culture, blood (routine x 2)     Status: None (Preliminary result)   Collection Time: 06/09/20 12:36 AM   Specimen: BLOOD  Result Value Ref Range Status   Specimen Description BLOOD RIGHT ANTECUBITAL  Final   Special Requests   Final    BOTTLES DRAWN AEROBIC AND ANAEROBIC Blood Culture adequate volume   Culture   Final    NO GROWTH < 12 HOURS Performed at [facility], [address], [place], [place] [address]    Report Status  PENDING  Incomplete  Culture, blood (routine x 2)     Status: None (Preliminary result)   Collection Time: 06/09/20 12:36 AM   Specimen: BLOOD  Result Value Ref Range Status   Specimen Description BLOOD BLOOD RIGHT ARM  Final   Special Requests   Final    BOTTLES DRAWN AEROBIC AND ANAEROBIC Blood Culture adequate volume   Culture   Final    NO GROWTH < 12 HOURS Performed at [facility], [address] [place], [place] [address]    Report Status PENDING  Incomplete  Respiratory Panel by RT PCR (Flu A&B, Covid) - Nasopharyngeal Swab     Status: None   Collection Time: 06/09/20  8:13 AM   Specimen: Nasopharyngeal Swab  Result Value Ref Range Status   SARS Coronavirus 2 by RT PCR NEGATIVE NEGATIVE Final    Comment: (NOTE) SARS-CoV-2 target nucleic acids are NOT DETECTED.  The SARS-CoV-2 RNA is generally detectable in upper respiratoy specimens during the acute phase of infection. The lowest concentration of SARS-CoV-2 viral copies this assay can detect is 131 copies/mL. A negative result does not preclude SARS-Cov-2 infection and should not be used as the sole basis for treatment or other patient management decisions. A negative result may occur with  improper specimen collection/handling, submission of specimen other than nasopharyngeal swab, presence of viral mutation(s) within the areas targeted by this assay, and inadequate number of viral copies (<131 copies/mL). A negative result must be combined with clinical observations, patient history, and epidemiological information. The expected result is Negative.  Fact Sheet for Patients:  PinkCheek.be  Fact Sheet for Healthcare Providers:  GravelBags.it  This test is no t yet approved or cleared by the [place] FDA and  has been authorized for detection and/or diagnosis of SARS-CoV-2 by FDA under an Emergency Use Authorization (EUA). This EUA will remain   in effect (meaning this test can be used) for the duration of the COVID-19 declaration under Section 564(b)(1) of the Act, 21 U.S.C. section 360bbb-3(b)(1), unless the authorization is terminated or revoked sooner.     Influenza A by PCR NEGATIVE NEGATIVE Final   Influenza B by PCR NEGATIVE NEGATIVE Final    Comment: (NOTE) The Xpert Xpress SARS-CoV-2/FLU/RSV assay is intended as an aid in  the diagnosis of influenza from Nasopharyngeal swab specimens and  should not be used as a sole basis for treatment. Nasal washings and  aspirates are unacceptable for Xpert Xpress SARS-CoV-2/FLU/RSV  testing.  Fact Sheet for Patients: PinkCheek.be  Fact Sheet for Healthcare Providers: GravelBags.it  This test is not yet approved or cleared by the [place] FDA and  has been authorized for detection and/or diagnosis of SARS-CoV-2 by  FDA under an Emergency Use Authorization (EUA). This EUA will remain  in effect (meaning this test can be used) for the duration of the  Covid-19 declaration under Section 564(b)(1) of the Act, 21  U.S.C. section 360bbb-3(b)(1), unless the authorization is  terminated or revoked. Performed at [facility], [address] [place], [place] [address]     Time coordinating discharge:  25 minutes.   SIGNED:  Ivor Costa, MD [facility] 06/09/2020, 5:49 PM   If 7PM-7AM, please contact night-coverage [email or web address]

## 2020-06-09 NOTE — Plan of Care (Signed)
  Problem: Education: Goal: Knowledge of General Education information will improve Description: Including pain rating scale, medication(s)/side effects and non-pharmacologic comfort measures Outcome: Progressing   Problem: Health Behavior/Discharge Planning: Goal: Ability to manage health-related needs will improve Outcome: Progressing   Problem: Clinical Measurements: Goal: Ability to maintain clinical measurements within normal limits will improve Outcome: Progressing Goal: Will remain free from infection Outcome: Progressing   Problem: Clinical Measurements: Goal: Ability to avoid or minimize complications of infection will improve Outcome: Progressing   Problem: Skin Integrity: Goal: Skin integrity will improve Outcome: Progressing

## 2020-06-09 NOTE — ED Notes (Addendum)
Pt had to leave ED treatment room for a short period of time to help children. MD aware. Pt will return shortly.

## 2020-06-09 NOTE — H&P (Signed)
History and Physical    TARAANN OLTHOFF NOB:[id number] DOB: 11-13-1990 DOA: 06/09/2020  Referring MD/NP/PA:   PCP: [facility]   Patient coming from:  The patient is coming from home.  At baseline, pt is independent for most of ADL.        Chief Complaint: right facial pain and swelling  HPI: Nicole Kaufman is a 29 y.o. female with medical history significant of anemia, who presents with rectal facial pain and swelling.  Patient states that her symptoms started 5 days ago, including right facial pain and swelling, which has been progressively worsening. Patient was seen in the ED on 10/31, and was started on amoxicillin. Patient is taking antibiotics without improvement. She states that her symptom has been progressively worsening. Currently the pain is constant, sharp, 10 out of 10 severity, nonradiating. Patient denies fever or chills. No chest pain, shortness breath, cough. No nausea vomiting, diarrhea or abdominal pain. No symptoms of UTI or unilateral weakness.  ED Course: pt was found to have WBC 8.4, lactic acid 1.0, pending COVID-19 PCR, electrolytes renal function okay, temperature 99.8, blood pressure 132/94, heart rate 96, RR 16. Patient is admitted to [place] bed as inpatient by accepting MD.  CT-maxillofacial 1. Asymmetric soft tissue swelling with inflammatory stranding involving the right face, suspicious for acute infection/cellulitis. No discrete abscess or drainable fluid collection. 2. Poor dentition with innumerable dental caries, raising the possibility for an odontogenic source. However, there is also evidence for right maxillary sinus disease, which could also be considered as a potential source of infection as well. Appropriate tailoring of antibiotic regimen recommended.    Review of Systems:   General: no fevers, chills, no body weight gain, has fatigue HEENT: no blurry vision, hearing changes or sore throat. Has right facial pain and  swelling. Respiratory: no dyspnea, coughing, wheezing CV: no chest pain, no palpitations GI: no nausea, vomiting, abdominal pain, diarrhea, constipation GU: no dysuria, burning on urination, increased urinary frequency, hematuria  Ext: no leg edema Neuro: no unilateral weakness, numbness, or tingling, no vision change or hearing loss Skin: no rash, no skin tear. MSK: No muscle spasm, no deformity, no limitation of range of movement in spin Heme: No easy bruising.  Travel history: No recent long distant travel.  Allergy:  Allergies  Allergen Reactions  . Norco [Hydrocodone-Acetaminophen] Nausea And Vomiting    Past Medical History:  Diagnosis Date  . Anemia   . Medical history non-contributory     Past Surgical History:  Procedure Laterality Date  . LAPAROSCOPIC BILATERAL SALPINGECTOMY Bilateral 12/01/2019   Procedure: LAPAROSCOPIC BILATERAL SALPINGECTOMY;  Surgeon: Malachy Mood, MD;  Location: [facility] ORS;  Service: Gynecology;  Laterality: Bilateral;  . NO PAST SURGERIES      Social History:  reports that she has never smoked. She has never used smokeless tobacco. She reports that she does not drink alcohol and does not use drugs.  Family History:  Family History  Problem Relation Age of Onset  . Congestive Heart Failure Mother   . Diabetes Mother   . Diabetes Father      Prior to Admission medications   Medication Sig Start Date End Date Taking? Authorizing Provider  amoxicillin (AMOXIL) 500 MG capsule Take 1 capsule (500 mg total) by mouth 3 (three) times daily. 06/05/20  Yes Laban Emperor, PA-C  diphenhydrAMINE (BENADRYL) 25 MG tablet Take 25 mg by mouth every 6 (six) hours as needed.   Yes [provider]  ibuprofen (ADVIL) 600  MG tablet Take 1 tablet (600 mg total) by mouth every 6 (six) hours as needed. 06/05/20  Yes Laban Emperor, PA-C  lidocaine (XYLOCAINE) 2 % solution Use as directed 10 mLs in the mouth or throat as needed. 06/05/20  Yes Laban Emperor, PA-C    Physical Exam: Vitals:   06/09/20 0723 06/09/20 0730 06/09/20 0756 06/09/20 0840  BP: (!) 127/92 (!) 124/91 126/81 (!) 135/97  Pulse: 98  86 80  Resp: 16  16 16   Temp: 99.5 F (37.5 C)  98.3 F (36.8 C) (!) 97.5 F (36.4 C)  TempSrc: Oral  Oral Oral  SpO2: 99%  98% 100%  Weight:      Height:       General: Not in acute distress HEENT: has swelling, tenderness and warmth in right face. Has poor dentition on the right upper jaw       Eyes: PERRL, EOMI, no scleral icterus.       ENT: No discharge from the ears and nose, no pharynx injection, no tonsillar enlargement.        Neck: No JVD, no bruit, no mass felt. Heme: No neck lymph node enlargement. Cardiac: S1/S2, RRR, No murmurs, No gallops or rubs. Respiratory: No rales, wheezing, rhonchi or rubs. GI: Soft, nondistended, nontender, no rebound pain, no organomegaly, BS present. GU: No hematuria Ext: No pitting leg edema bilaterally. 2+DP/PT pulse bilaterally. Musculoskeletal: No joint deformities, No joint redness or warmth, no limitation of ROM in spin. Skin: No rashes.  Neuro: Alert, oriented X3, cranial nerves II-XII grossly intact, moves all extremities normally.  Psych: Patient is not psychotic, no suicidal or hemocidal ideation.  Labs on Admission: I have personally reviewed following labs and imaging studies  CBC: Recent Labs  Lab 06/09/20 0036 06/09/20 0500  WBC 8.4 8.8  NEUTROABS 4.6  --   HGB 12.6 12.3  HCT 40.9 39.6  MCV 78.8* 77.6*  PLT 188 937   Basic Metabolic Panel: Recent Labs  Lab 06/09/20 0036 06/09/20 0500  NA 137 139  K 3.5 3.4*  CL 102 103  CO2 27 26  GLUCOSE 101* 101*  BUN 5* <5*  CREATININE 0.81 0.66  CALCIUM 8.9 8.8*   GFR: Estimated Creatinine Clearance: 94.2 mL/min (by C-G formula based on SCr of 0.66 mg/dL). Liver Function Tests: No results for input(s): AST, ALT, ALKPHOS, BILITOT, PROT, ALBUMIN in the last 168 hours. No results for input(s): LIPASE, AMYLASE in  the last 168 hours. No results for input(s): AMMONIA in the last 168 hours. Coagulation Profile: No results for input(s): INR, PROTIME in the last 168 hours. Cardiac Enzymes: No results for input(s): CKTOTAL, CKMB, CKMBINDEX, TROPONINI in the last 168 hours. BNP (last 3 results) No results for input(s): PROBNP in the last 8760 hours. HbA1C: No results for input(s): HGBA1C in the last 72 hours. CBG: No results for input(s): GLUCAP in the last 168 hours. Lipid Profile: No results for input(s): CHOL, HDL, LDLCALC, TRIG, CHOLHDL, LDLDIRECT in the last 72 hours. Thyroid Function Tests: No results for input(s): TSH, T4TOTAL, FREET4, T3FREE, THYROIDAB in the last 72 hours. Anemia Panel: No results for input(s): VITAMINB12, FOLATE, FERRITIN, TIBC, IRON, RETICCTPCT in the last 72 hours. Urine analysis:    Component Value Date/Time   GLUCOSEU Negative 12/01/2018 1530   Sepsis Labs: @LABRCNTIP (procalcitonin:4,lacticidven:4) ) Recent Results (from the past 240 hour(s))  Culture, blood (routine x 2)     Status: None (Preliminary result)   Collection Time: 06/09/20 12:36 AM   Specimen:  BLOOD  Result Value Ref Range Status   Specimen Description BLOOD RIGHT ANTECUBITAL  Final   Special Requests   Final    BOTTLES DRAWN AEROBIC AND ANAEROBIC Blood Culture adequate volume   Culture   Final    NO GROWTH < 12 HOURS Performed at [facility], [address] [place], [place] [address]    Report Status PENDING  Incomplete  Culture, blood (routine x 2)     Status: None (Preliminary result)   Collection Time: 06/09/20 12:36 AM   Specimen: BLOOD  Result Value Ref Range Status   Specimen Description BLOOD BLOOD RIGHT ARM  Final   Special Requests   Final    BOTTLES DRAWN AEROBIC AND ANAEROBIC Blood Culture adequate volume   Culture   Final    NO GROWTH < 12 HOURS Performed at [facility], [address] [place], [place] [address]    Report Status PENDING  Incomplete       Radiological Exams on Admission: CT Maxillofacial W Contrast  Result Date: 06/09/2020 CLINICAL DATA:  Initial evaluation for acute right facial swelling, cellulitis. EXAM: CT MAXILLOFACIAL WITH CONTRAST TECHNIQUE: Multidetector CT imaging of the maxillofacial structures was performed with intravenous contrast. Multiplanar CT image reconstructions were also generated. CONTRAST:  95m OMNIPAQUE IOHEXOL 300 MG/ML  SOLN COMPARISON:  None. FINDINGS: Osseous: No acute osseous abnormality. Orbits: Globes and orbital soft tissues within normal limits. No evidence for intraorbital or postseptal cellulitis. Sinuses: Moderate mucosal thickening noted within the ethmoidal air cells and right maxillary sinus. Paranasal sinuses are otherwise largely clear. Visualize mastoids and middle ear cavities are clear as well. Soft tissues: Asymmetric soft tissue swelling with inflammatory stranding seen involving the right face, primarily involving the right masticator space. Extension to involve the right nasal labial fold. Findings suspicious for acute infection/cellulitis. There are innumerable dental caries, raising the possibility for an odontogenic source. However, the presence of right maxillary sinus disease could also be considered as a potential source as well. Retro antral fat maintained at this time, with no extension of inflammatory changes into the deeper spaces of the face/neck. No discrete abscess or drainable fluid collection. Few prominent right level 1 B nodes measuring up to 1 cm noted, presumably reactive. Limited intracranial: Unremarkable. IMPRESSION: 1. Asymmetric soft tissue swelling with inflammatory stranding involving the right face, suspicious for acute infection/cellulitis. No discrete abscess or drainable fluid collection. 2. Poor dentition with innumerable dental caries, raising the possibility for an odontogenic source. However, there is also evidence for right maxillary sinus disease, which could  also be considered as a potential source of infection as well. Appropriate tailoring of antibiotic regimen recommended. Electronically Signed   By: BJeannine BogaM.D.   On: 06/09/2020 02:00     EKG:  Not done in ED.   Assessment/Plan Principal Problem:   Facial cellulitis   Facial cellulitis-right: Patient failed outpatient oral antibiotic treatment. CT scan did not show evidence of abscess, but showed poor dentition with innumerable dental caries and possible right maxillary sinusitis, which are likely the source of infection. Patient has tachycardia, but no fever or leukocytosis. Currently not septic. Hemodynamically stable.  - Admitted to MedSurg bed as inpatient by accepting MD - Empiric antimicrobial treatment with IV clindamycin - PRN Zofran for nausea, and Percocet and ibuprofen for pain - Blood cultures x 2  - ESR and CRP    DVT ppx:  SQ Lovenox Code Status: Full code Family Communication: not done, no family member is  at bed side. Disposition Plan:  Anticipate discharge back to previous environment Consults called:  none Admission status: Med-surg bed as inpt      Status is: Inpatient  Remains inpatient appropriate because:Inpatient level of care appropriate due to severity of illness. Pt presents with right facial cellulitis, failed outpatient oral antibiotic treatment. Patient does not have fever or leukocytosis, but has tachycardia. Patient is at high risk of developing sepsis though currently not meeting criteria for sepsis. Her presentation is highly complicated. Patient will need to be treated in hospital for at least 2 days.   Dispo: The patient is from: Home              Anticipated d/c is to: Home              Anticipated d/c date is: 2 days              Patient currently is not medically stable to d/c.          Date of Service 06/09/2020    Ivor Costa [facility]   If 7PM-7AM, please contact night-coverage [email or web address] 06/09/2020,  9:41 AM

## 2020-06-14 LAB — CULTURE, BLOOD (ROUTINE X 2)
Culture: NO GROWTH
Culture: NO GROWTH
Special Requests: ADEQUATE
Special Requests: ADEQUATE

## 2020-07-11 IMAGING — CR DG CHEST 2V
1 series · 3 of 3 positions shown · non-contrast
Comparison: None.

CLINICAL DATA: Cough.

EXAM:
CHEST - 2 VIEW

[Series 1: dg chest 2 view · 0.14mm/px · 3 of 3 slices shown]
[im 1/3]
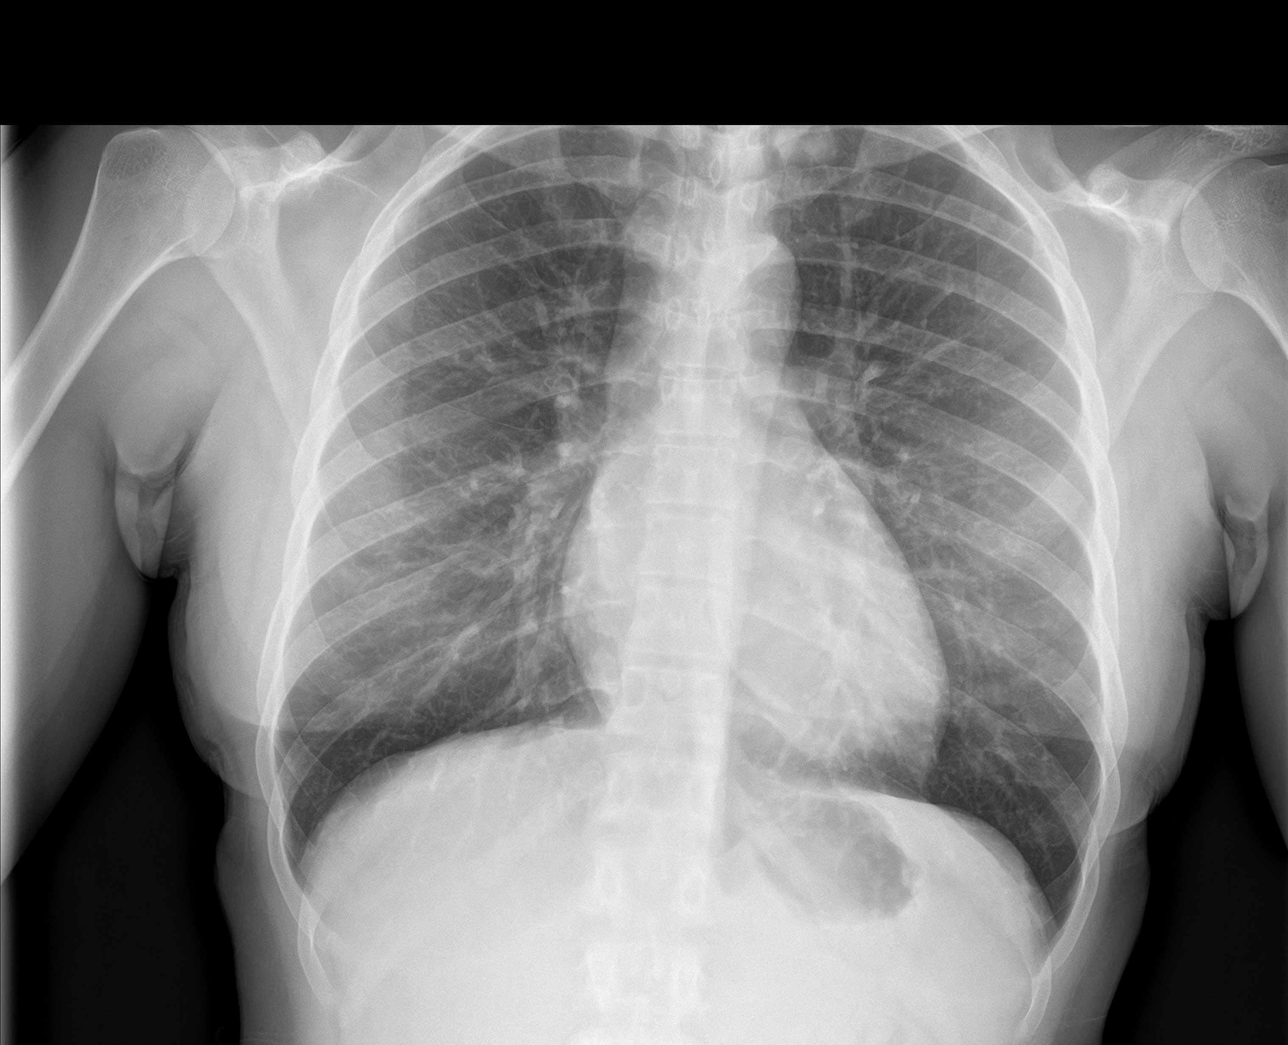
[im 2/3]
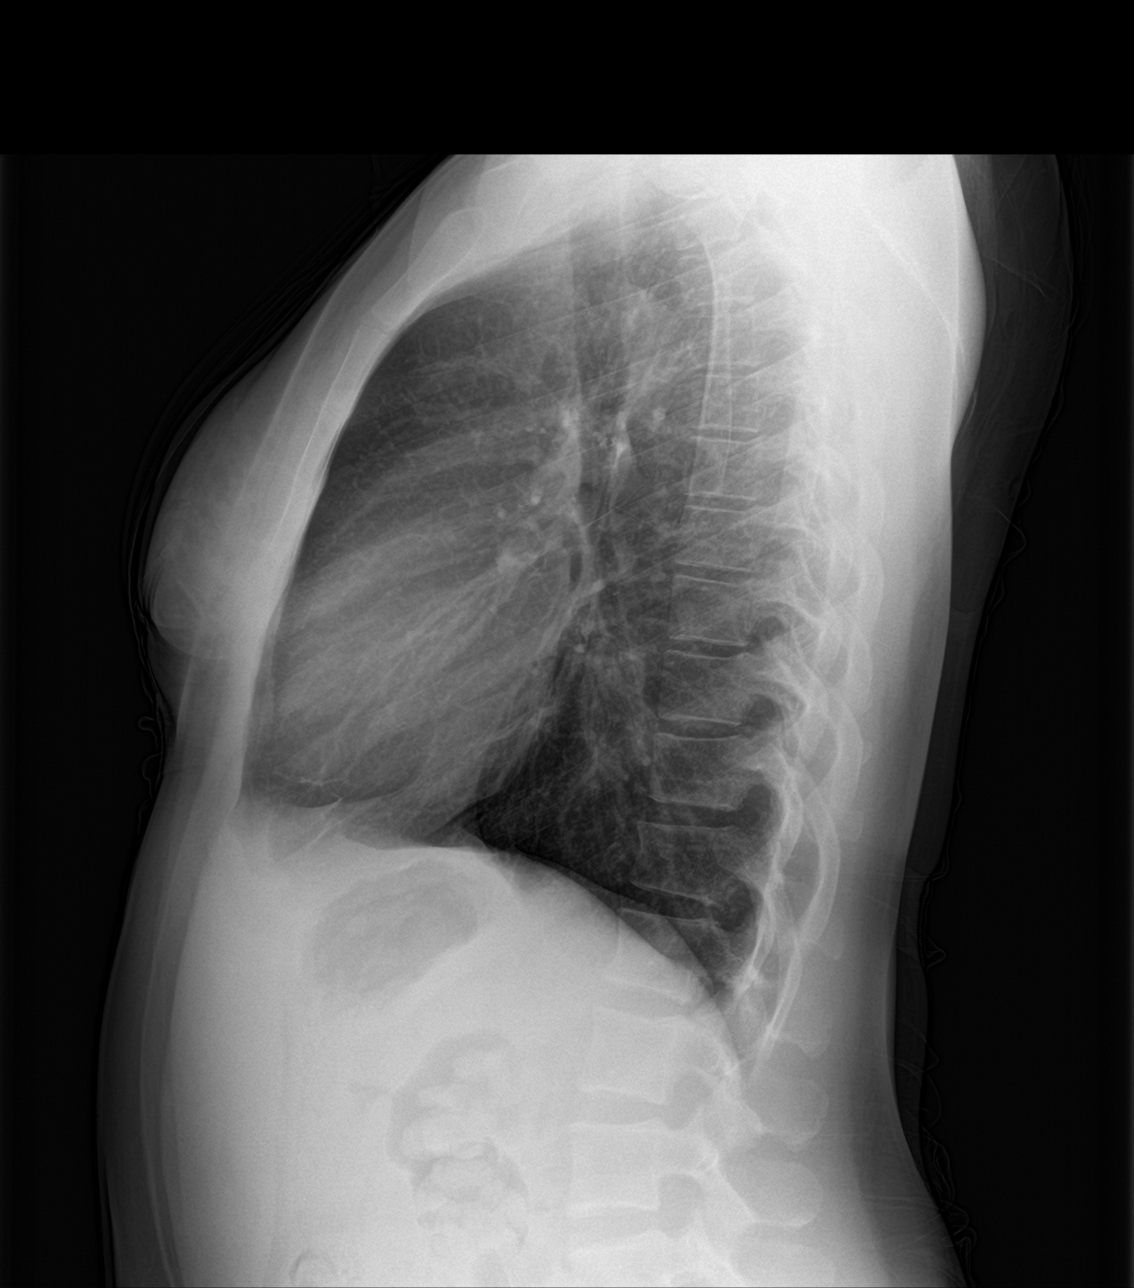
[im 3/3]
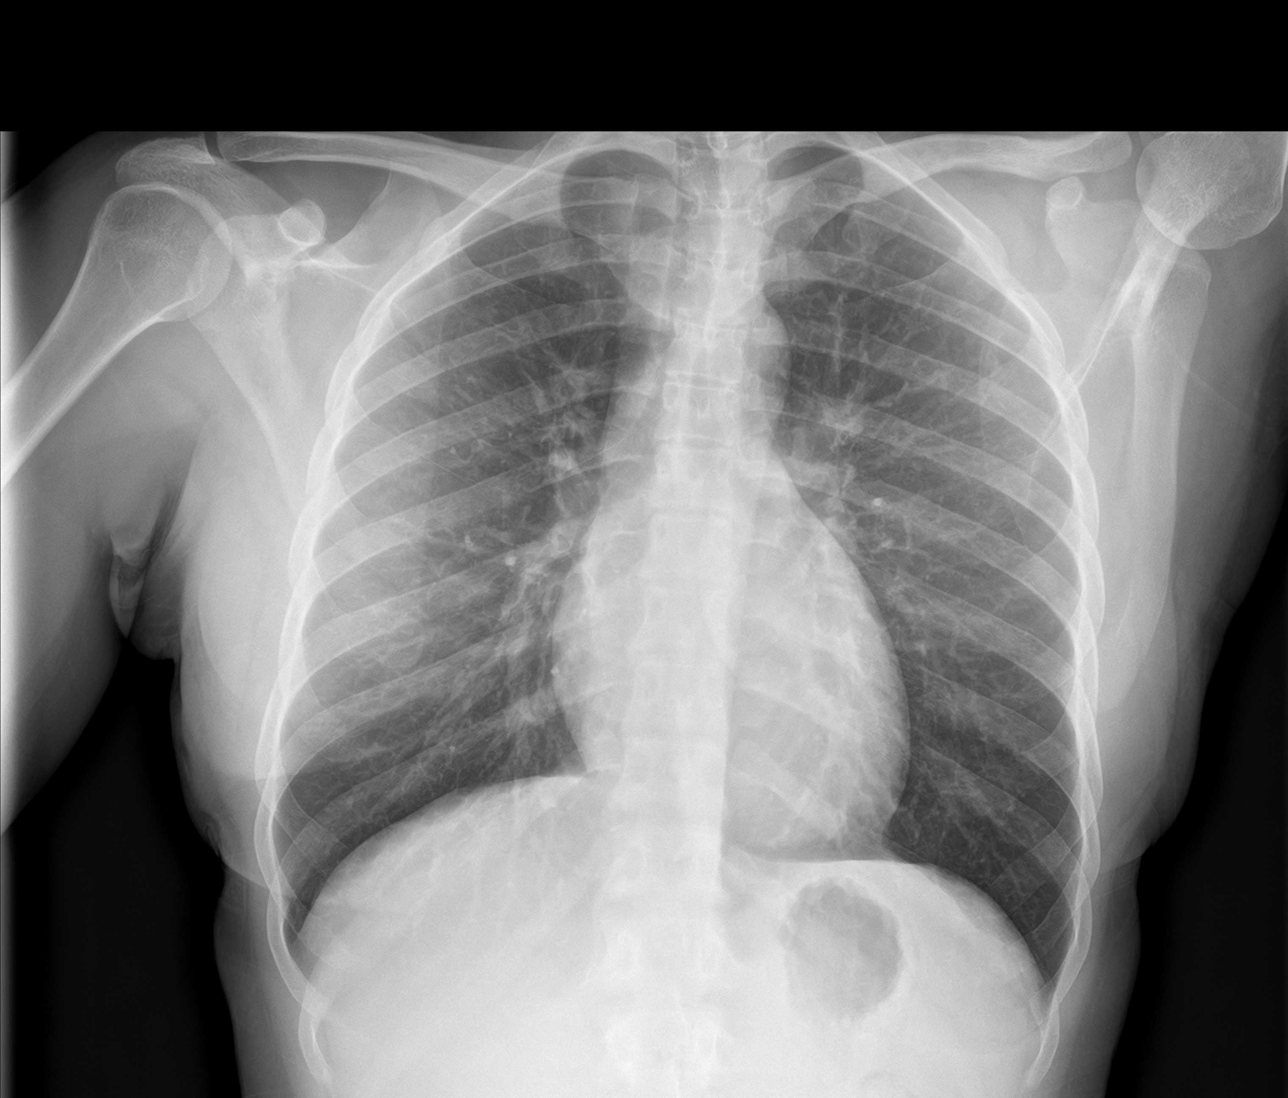

[3 of 3 positions shown; findings below may reference images not displayed]

FINDINGS: The heart size and mediastinal contours are within normal limits.
Both lungs are clear. The visualized skeletal structures are
unremarkable.
IMPRESSION: No active cardiopulmonary disease.

## 2020-12-12 ENCOUNTER — Emergency Department
Admission: EM | Admit: 2020-12-12 | Discharge: 2020-12-12 | Disposition: A | Discharge status: Home / Self Care | Payer: Medicaid Other | Attending: Emergency Medicine | Admitting: Emergency Medicine

## 2020-12-12 ENCOUNTER — Other Ambulatory Visit: Payer: Self-pay

## 2020-12-12 DIAGNOSIS — L03211 Cellulitis of face: Secondary | ICD-10-CM

## 2020-12-12 DIAGNOSIS — R220 Localized swelling, mass and lump, head: Secondary | ICD-10-CM | POA: Diagnosis present

## 2020-12-12 NOTE — ED Triage Notes (Signed)
Pt comes with c/o right sided facial swelling. Pt states she noticed it on her nose and now spreading to her under eye and face.  Pt denies any new allergies or meds that she is aware of.

## 2020-12-12 NOTE — ED Provider Notes (Signed)
[facility] Emergency Department Provider Note   ____________________________________________    I have reviewed the triage vital signs and the nursing notes.   HISTORY  Chief Complaint Facial Swelling     HPI Nicole Kaufman is a 30 y.o. female who presents with complaints of right-sided facial swelling.  Patient reports she was seen at [facility] emergency department was treated with a dose of IV antibiotics and had antibiotics prescribed for her, however she was unable to pick them up yesterday.  She presents to the emergency department today because symptoms are not significantly changed.  No fevers or chills.  She reports he has had this once before back in the fall and at that time had IV antibiotics as well but then left AMA.  Reviewed records from November 4, CT at that time showed likely odontogenic infection  Past Medical History:  Diagnosis Date  . Anemia   . Medical history non-contributory     Patient Active Problem List   Diagnosis Date Noted  . Facial cellulitis 06/09/2020  . Unwanted fertility   . Bacterial vaginosis 03/03/2019    Past Surgical History:  Procedure Laterality Date  . LAPAROSCOPIC BILATERAL SALPINGECTOMY Bilateral 12/01/2019   Procedure: LAPAROSCOPIC BILATERAL SALPINGECTOMY;  Surgeon: Vena Austria, MD;  Location: [facility] ORS;  Service: Gynecology;  Laterality: Bilateral;  . NO PAST SURGERIES      Prior to Admission medications   Medication Sig Start Date End Date Taking? Authorizing Provider  amoxicillin (AMOXIL) 500 MG capsule Take 1 capsule (500 mg total) by mouth 3 (three) times daily. 06/05/20   Enid Derry, PA-C  diphenhydrAMINE (BENADRYL) 25 MG tablet Take 25 mg by mouth every 6 (six) hours as needed.    [provider]  ibuprofen (ADVIL) 600 MG tablet Take 1 tablet (600 mg total) by mouth every 6 (six) hours as needed. 06/05/20   Enid Derry, PA-C  lidocaine (XYLOCAINE) 2 % solution Use  as directed 10 mLs in the mouth or throat as needed. 06/05/20   Enid Derry, PA-C     Allergies Norco [hydrocodone-acetaminophen]  Family History  Problem Relation Age of Onset  . Congestive Heart Failure Mother   . Diabetes Mother   . Diabetes Father     Social History Social History   Tobacco Use  . Smoking status: Never Smoker  . Smokeless tobacco: Never Used  Vaping Use  . Vaping Use: Never used  Substance Use Topics  . Alcohol use: No  . Drug use: No    Review of Systems  Constitutional: No fever/chills  ENT: No dental pain   Gastrointestinal: No abdominal pain.   Skin: Mild erythema to the face Neurological: Negative for headaches     ____________________________________________   PHYSICAL EXAM:  VITAL SIGNS: ED Triage Vitals  Enc Vitals Group     BP 12/12/20 0941 (!) 129/91     Pulse Rate 12/12/20 0941 77     Resp 12/12/20 0941 20     Temp 12/12/20 0941 97.7 F (36.5 C)     Temp Source 12/12/20 0941 Oral     SpO2 12/12/20 0941 100 %     Weight 12/12/20 0942 69.3 kg (152 lb 12.8 oz)     Height 12/12/20 0942 1.6 m (5\' 3" )     Head Circumference --      Peak Flow --      Pain Score 12/12/20 0936 2     Pain Loc --  Pain Edu? --      Excl. in GC? --      Constitutional: Alert and oriented. No acute distress. Pleasant and interactive Eyes: Conjunctivae are normal.  EOMI Head: Mild swelling to the right cheek, mild tenderness to palpation, no drainable collection Nose: No congestion/rhinnorhea. Mouth/Throat: Mucous membranes are moist.   Cardiovascular: Normal rate, regular rhythm.  Respiratory: Normal respiratory effort.  No retractions.   Neurologic:  Normal speech and language. No gross focal neurologic deficits are appreciated.   Skin:  Skin is warm, dry and intact.    ____________________________________________   LABS (all labs ordered are listed, but only abnormal results are displayed)  Labs Reviewed - No data to  display ____________________________________________  EKG   ____________________________________________  RADIOLOGY  None ____________________________________________   PROCEDURES  Procedure(s) performed: No  Procedures   Critical Care performed: No ____________________________________________   INITIAL IMPRESSION / ASSESSMENT AND PLAN / ED COURSE  Pertinent labs & imaging results that were available during my care of the patient were reviewed by me and considered in my medical decision making (see chart for details).  Exam is most consistent with facial cellulitis, uncomplicated encouraged patient to pick up antibiotics that were prescribed for her and be sure to complete the entire course.  If no improvement or worsening return to the ED   ____________________________________________   FINAL CLINICAL IMPRESSION(S) / ED DIAGNOSES  Final diagnoses:  Facial cellulitis      NEW MEDICATIONS STARTED DURING THIS VISIT:  Discharge Medication List as of 12/12/2020  9:54 AM       Note:  This document was prepared using Dragon voice recognition software and may include unintentional dictation errors.   Jene Every, MD 12/12/20 1155

## 2021-02-05 IMAGING — CT CT MAXILLOFACIAL W/ CM
3 series · 15 of 47 positions shown, 18 images · IV contrast (omnipaque)
Comparison: None.

CLINICAL DATA: Initial evaluation for acute right facial swelling,
cellulitis.

EXAM:
CT MAXILLOFACIAL WITH CONTRAST
TECHNIQUE: Multidetector CT imaging of the maxillofacial structures was
performed with intravenous contrast. Multiplanar CT image
reconstructions were also generated.
CONTRAST:  75mL OMNIPAQUE IOHEXOL 300 MG/ML  SOLN

[Series 2: max soft · axial · 0.34mm/px · z∈[-174,-50]mm · 9 of 74 slices shown, 12 images]
[im 6/74  brain]
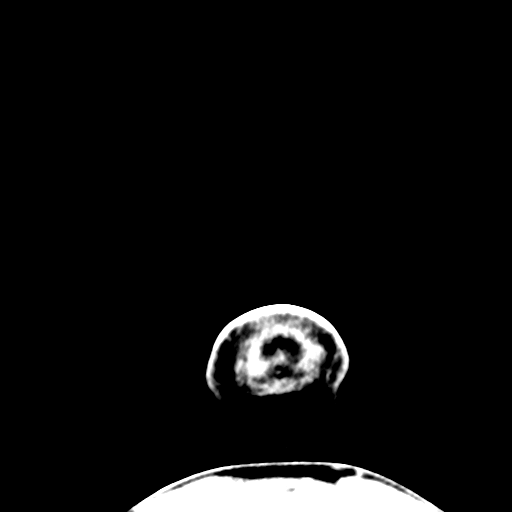
[im 6/74  bone]
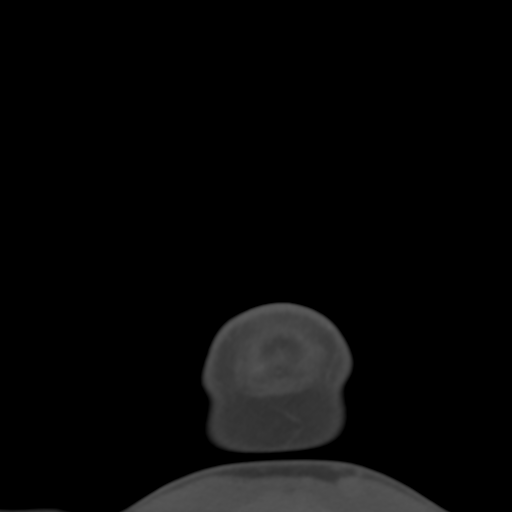
[im 13/74  bone]
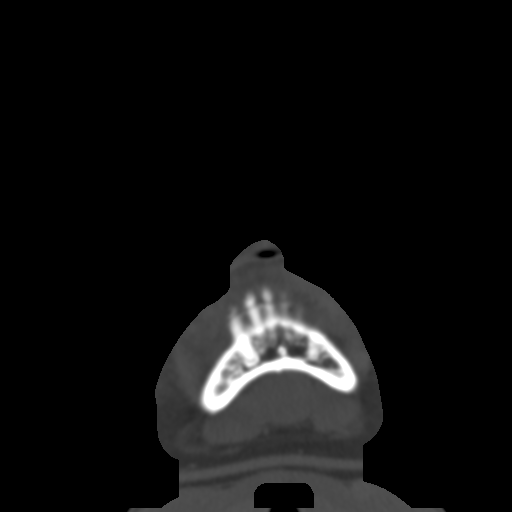
[im 21/74  bone]
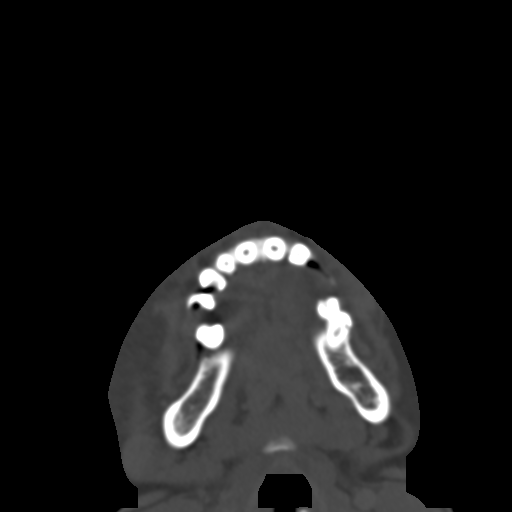
[im 28/74  bone]
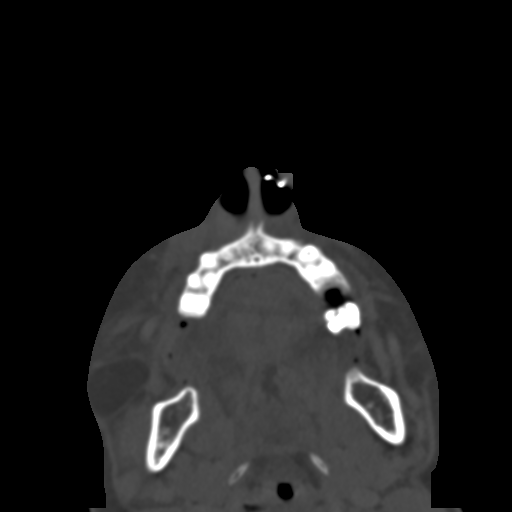
[im 38/74  brain]
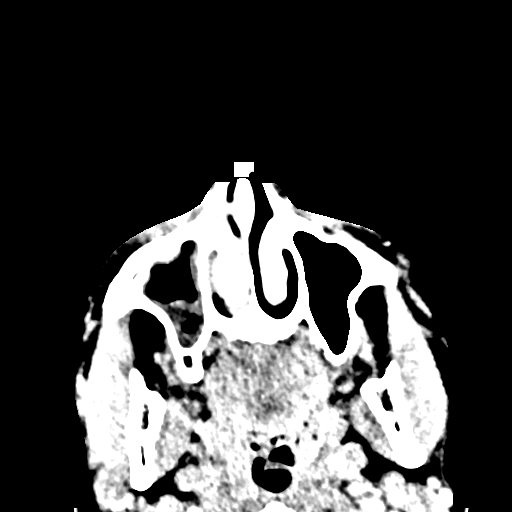
[im 38/74  bone]
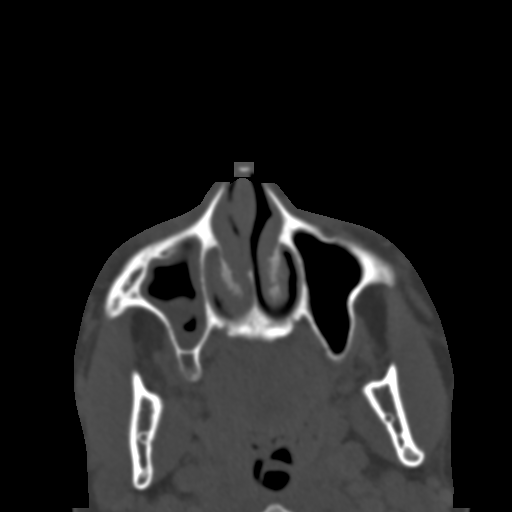
[im 46/74  bone]
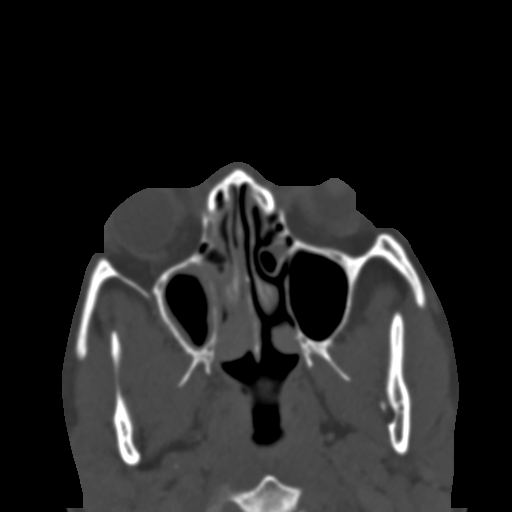
[im 53/74  bone]
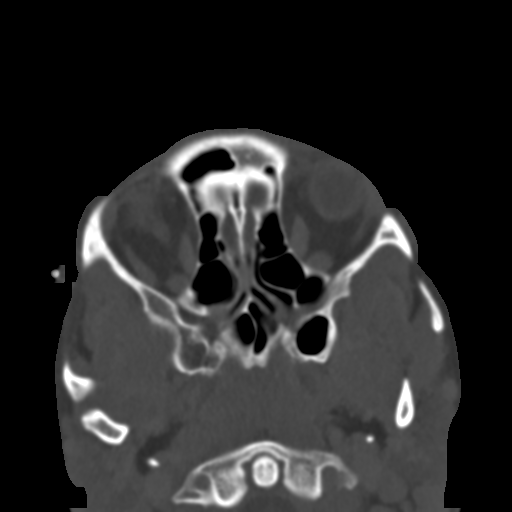
[im 61/74  bone]
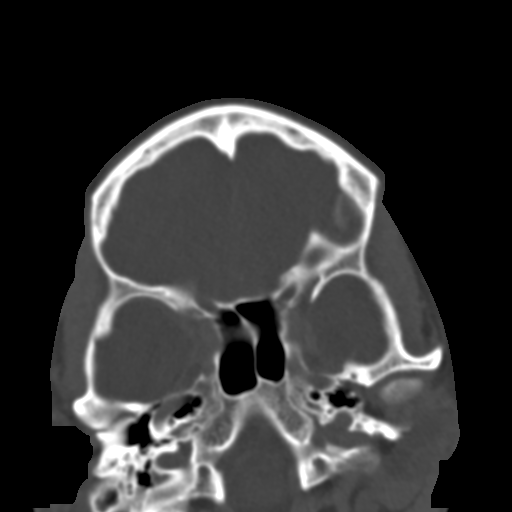
[im 68/74  brain]
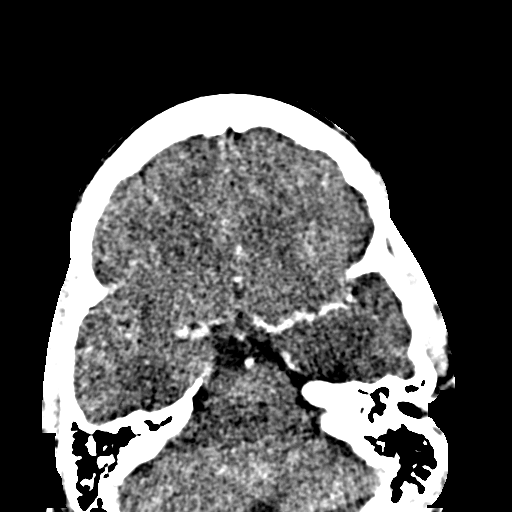
[im 68/74  bone]
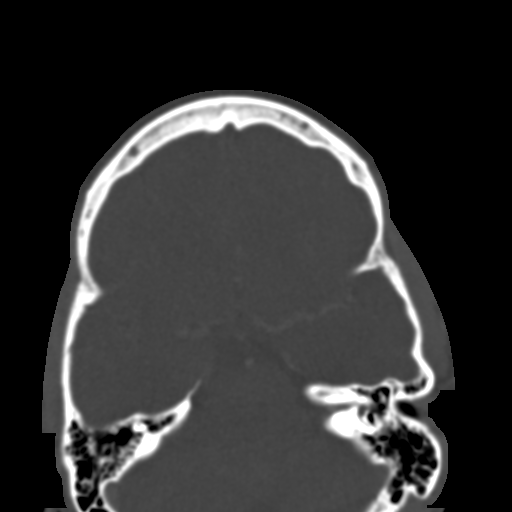

[Series 4: coronal soft · coronal · 0.30mm/px · 3 of 75 slices shown]
[im 25/75  bone]
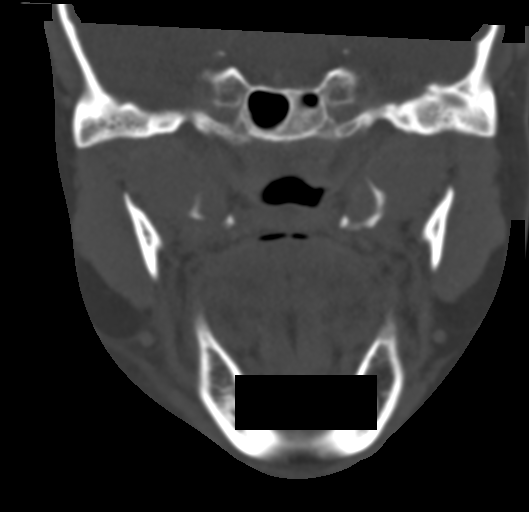
[im 33/75  bone]
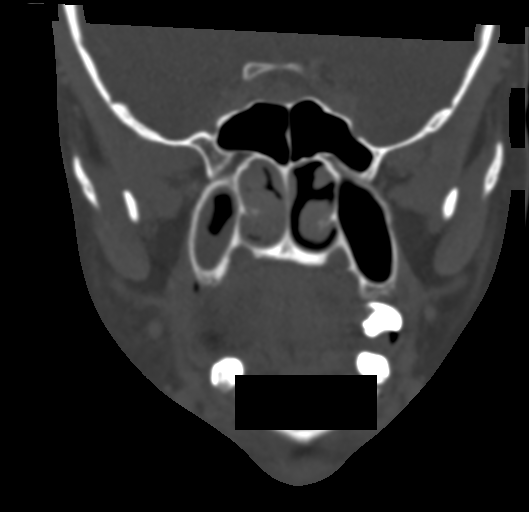
[im 42/75  bone]
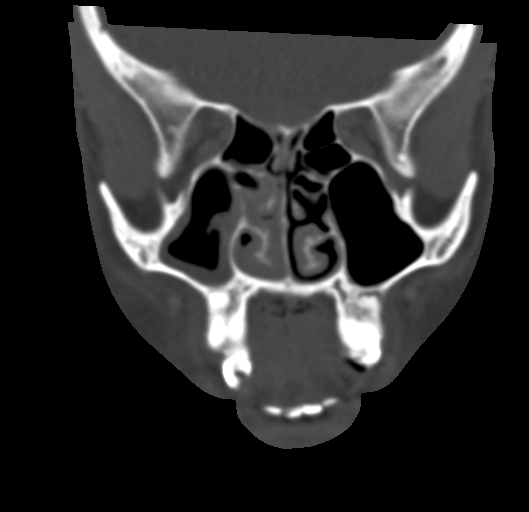

[Series 5: sagittal soft · sagittal · 0.30mm/px · 3 of 103 slices shown]
[im 35/103  bone]
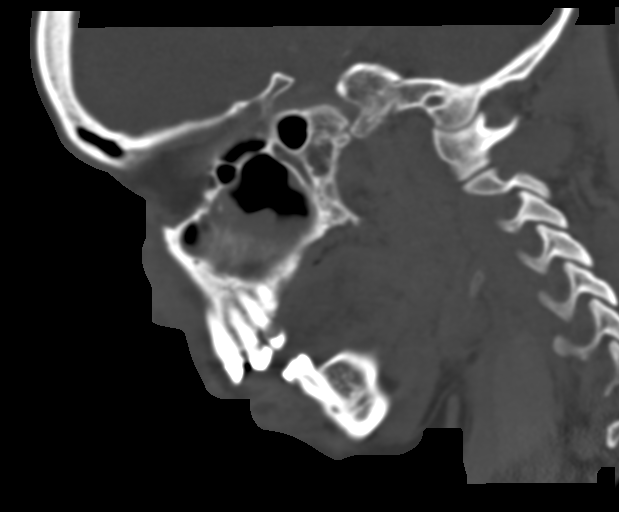
[im 52/103  bone]
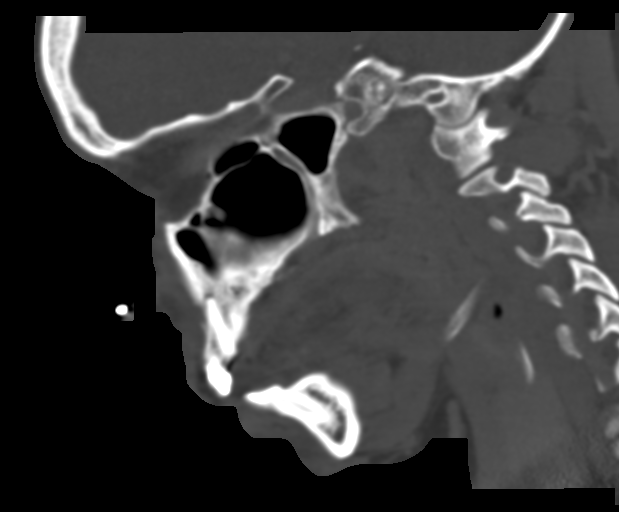
[im 69/103  bone]
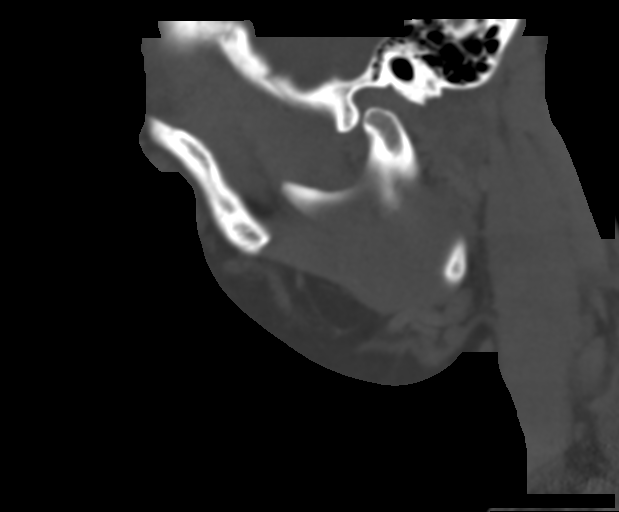

[15 of 47 positions shown; findings below may reference images not displayed]

FINDINGS: Osseous: No acute osseous abnormality.

Orbits: Globes and orbital soft tissues within normal limits. No
evidence for intraorbital or postseptal cellulitis.

Sinuses: Moderate mucosal thickening noted within the ethmoidal air
cells and right maxillary sinus. Paranasal sinuses are otherwise
largely clear. Visualize mastoids and middle ear cavities are clear
as well.

Soft tissues: Asymmetric soft tissue swelling with inflammatory
stranding seen involving the right face, primarily involving the
right masticator space. Extension to involve the right nasal labial
fold. Findings suspicious for acute infection/cellulitis. There are
innumerable dental caries, raising the possibility for an
odontogenic source. However, the presence of right maxillary sinus
disease could also be considered as a potential source as well.
Retro antral fat maintained at this time, with no extension of
inflammatory changes into the deeper spaces of the face/neck. No
discrete abscess or drainable fluid collection. Few prominent right
level 1 B nodes measuring up to 1 cm noted, presumably reactive.

Limited intracranial: Unremarkable.
IMPRESSION: 1. Asymmetric soft tissue swelling with inflammatory stranding
involving the right face, suspicious for acute infection/cellulitis.
No discrete abscess or drainable fluid collection.
2. Poor dentition with innumerable dental caries, raising the
possibility for an odontogenic source. However, there is also
evidence for right maxillary sinus disease, which could also be
considered as a potential source of infection as well. Appropriate
tailoring of antibiotic regimen recommended.

## 2022-01-06 ENCOUNTER — Emergency Department
Admission: EM | Admit: 2022-01-06 | Discharge: 2022-01-06 | Disposition: A | Discharge status: Home / Self Care | Payer: Medicaid Other | Attending: Emergency Medicine | Admitting: Emergency Medicine

## 2022-01-06 ENCOUNTER — Emergency Department: Payer: Medicaid Other

## 2022-01-06 ENCOUNTER — Encounter: Payer: Self-pay | Admitting: Emergency Medicine

## 2022-01-06 ENCOUNTER — Other Ambulatory Visit: Payer: Self-pay

## 2022-01-06 DIAGNOSIS — R519 Headache, unspecified: Secondary | ICD-10-CM

## 2022-01-06 DIAGNOSIS — G501 Atypical facial pain: Secondary | ICD-10-CM | POA: Diagnosis not present

## 2022-01-06 LAB — BASIC METABOLIC PANEL
Anion gap: 3 — ABNORMAL LOW (ref 5–15)
BUN: 7 mg/dL (ref 6–20)
CO2: 28 mmol/L (ref 22–32)
Calcium: 9 mg/dL (ref 8.9–10.3)
Chloride: 109 mmol/L (ref 98–111)
Creatinine, Ser: 0.76 mg/dL (ref 0.44–1.00)
GFR, Estimated: >60 mL/min (ref >60)
Glucose, Bld: 83 mg/dL (ref 70–99)
Potassium: 4.1 mmol/L (ref 3.5–5.1)
Sodium: 140 mmol/L (ref 135–145)

## 2022-01-06 LAB — CBC WITH DIFFERENTIAL/PLATELET
Abs Immature Granulocytes: 0.02 10*3/uL (ref 0.00–0.07)
Basophils Absolute: 0 10*3/uL (ref 0.0–0.1)
Basophils Relative: 0 %
Eosinophils Absolute: 0.1 10*3/uL (ref 0.0–0.5)
Eosinophils Relative: 2 %
HCT: 40.5 % (ref 36.0–46.0)
Hemoglobin: 12.4 g/dL (ref 12.0–15.0)
Immature Granulocytes: 0 %
Lymphocytes Relative: 47 %
Lymphs Abs: 3.5 10*3/uL (ref 0.7–4.0)
MCH: 24.3 pg — ABNORMAL LOW (ref 26.0–34.0)
MCHC: 30.6 g/dL (ref 30.0–36.0)
MCV: 79.4 fL — ABNORMAL LOW (ref 80.0–100.0)
Monocytes Absolute: 0.5 10*3/uL (ref 0.1–1.0)
Monocytes Relative: 6 %
Neutro Abs: 3.4 10*3/uL (ref 1.7–7.7)
Neutrophils Relative %: 45 %
Platelets: 204 10*3/uL (ref 150–400)
RBC: 5.1 MIL/uL (ref 3.87–5.11)
RDW: 13.3 % (ref 11.5–15.5)
WBC: 7.6 10*3/uL (ref 4.0–10.5)
nRBC: 0 % (ref 0.0–0.2)

## 2022-01-06 MED ORDER — MELOXICAM 15 MG PO TABS: 15.0000 mg | ORAL_TABLET | Freq: Every day | ORAL | 11 refills | Status: AC

## 2022-01-06 MED ORDER — LIDOCAINE-EPINEPHRINE 2 %-1:100000 IJ SOLN
1.7000 mL | Freq: Once | INTRAMUSCULAR | Status: AC
  Administered 2022-01-06: 1.7 mL
  Filled 2022-01-06: qty 1.7

## 2022-01-06 NOTE — ED Provider Notes (Signed)
[facility] Provider Note    Event Date/Time   First MD Initiated Contact with Patient 01/06/22 1639     (approximate)   History   Chief Complaint Facial Pain   HPI Nicole Kaufman is a 31 y.o. female, history of anemia, presents to the emergency department for evaluation of facial pain.  She reports left-sided facial pain for the past 4 days.  She states that she has had multiple dental issues in the past and frequently gets dental infections.  She has been on antibiotics for the past 2 weeks, which she says has improved her symptoms, however she continues to have significant pain in the right side of her face.  Additionally, she states that she feels like there is a mass behind her right nose that she has not felt before.  She states that she is currently looking for medicine to relieve pain for at least a few days.  She states that she has been taking ibuprofen consistently, though it is not enough.  She has an appointment in August to have all of her teeth removed due to these recurring infections.  Denies fever/chills, neck pain, dysphagia, throat swelling, vision change, hearing changes, headache, dizziness/lightheadedness, or urinary symptoms.  History Limitations: No limitations.        Physical Exam  Triage Vital Signs: ED Triage Vitals  Enc Vitals Group     BP 01/06/22 1623 (!) 167/108     Pulse Rate 01/06/22 1623 97     Resp 01/06/22 1623 20     Temp 01/06/22 1623 98.3 F (36.8 C)     Temp Source 01/06/22 1623 Oral     SpO2 01/06/22 1623 98 %     Weight 01/06/22 1624 125 lb (56.7 kg)     Height 01/06/22 1624 5\' 3"  (1.6 m)     Head Circumference --      Peak Flow --      Pain Score 01/06/22 1624 10     Pain Loc --      Pain Edu? --      Excl. in GC? --     Most recent vital signs: Vitals:   01/06/22 1623  BP: (!) 167/108  Pulse: 97  Resp: 20  Temp: 98.3 F (36.8 C)  SpO2: 98%    General: Awake, NAD.  Skin: Warm, dry. No  rashes or lesions.  Eyes: PERRL. Conjunctivae normal.  CV: Good peripheral perfusion.  Resp: Normal effort.  Abd: Soft, non-tender. No distention.  Neuro: At baseline. No gross neurological deficits.   Focused Exam: Overall poor dentition.  Multiple dental caries present.  No obvious abscesses present along the gumline.  No significant facial swelling, though patient does have tenderness upon palpation of the right maxillary sinus region.  No obvious masses in the nares.  No active bleeding or discharge.  No significant erythema or tenderness.  Physical Exam    ED Results / Procedures / Treatments  Labs (all labs ordered are listed, but only abnormal results are displayed) Labs Reviewed  CBC WITH DIFFERENTIAL/PLATELET - Abnormal; Notable for the following components:      Result Value   MCV 79.4 (*)    MCH 24.3 (*)    All other components within normal limits  BASIC METABOLIC PANEL - Abnormal; Notable for the following components:   Anion gap 3 (*)    All other components within normal limits     EKG N/A.   RADIOLOGY  ED Provider Interpretation:  I personally reviewed and interpreted these CT scan.  No clear abscess.  CT Maxillofacial WO CM  Result Date: 01/06/2022 CLINICAL DATA:  Suspected nasal abscess. Left-sided facial pain for 4 days. EXAM: CT MAXILLOFACIAL WITHOUT CONTRAST TECHNIQUE: Multidetector CT imaging of the maxillofacial structures was performed. Multiplanar CT image reconstructions were also generated. RADIATION DOSE REDUCTION: This exam was performed according to the departmental dose-optimization program which includes automated exposure control, adjustment of the mA and/or kV according to patient size and/or use of iterative reconstruction technique. COMPARISON:  None Available. FINDINGS: Osseous: No fracture or mandibular dislocation. No destructive process. Multiple missing teeth and dental caries. Periapical lucency about the left upper first molar. Orbits:  Negative. No traumatic or inflammatory finding. Sinuses: Mucosal thickening of the right greater than left maxillary sinus. Mucosal thickening of right side of sphenoid sinus. No sinus fluid levels. No cortical thickening or bone destruction. Soft tissues: No soft tissue fluid collection on this unenhanced exam. There may be mild infranasal soft tissue edema that is symmetric. No soft tissue gas. Right nasal ring in place. Limited intracranial: No significant or unexpected finding. IMPRESSION: 1. Multiple missing teeth and dental caries. Periapical lucency about the left upper first molar. 2. No evidence of soft tissue fluid collection on this unenhanced exam. There may be mild infranasal soft tissue edema that is symmetric. 3. Mucosal thickening of the right greater than left maxillary and right sphenoid sinuses. No sinus fluid levels. Electronically Signed   By: Keith Rake M.D.   On: 01/06/2022 17:33    PROCEDURES:  Critical Care performed: N/A.  Procedures    MEDICATIONS ORDERED IN ED: Medications  lidocaine-EPINEPHrine (XYLOCAINE W/EPI) 2 %-1:100000 (with pres) injection 1.7 mL (1.7 mLs Infiltration Given by Other 01/06/22 1838)     IMPRESSION / MDM / ASSESSMENT AND PLAN / ED COURSE  I reviewed the triage vital signs and the nursing notes.                              Differential diagnosis includes, but is not limited to, pulpitis, periapical abscess, facial cellulitis, odontogenic infection.  ED Course Patient appears well, NAD.  Currently afebrile.  CBC shows no leukocytosis or anemia.  BMP shows no evidence of AKI or electrolyte abnormalities.  Assessment/Plan Patient presents with maxillofacial pain, particular on the left side x4 days.  She states that she is currently on antibiotics for suspected dental infection and has been for the past 2 weeks.  She is currently requesting pain medication.  Her CT scan shows some mucosal thickening of the right greater than left  maxillary, but no obvious soft tissue fluid collections or abscesses.  Her lab work-up and vitals are reassuring.  Provide her with a infraorbital block for control of her pain.  She is requesting something different to take at home than ibuprofen.  We will provide her with a prescription for meloxicam.  Advised her that she could take Tylenol on top of this as well.  Advised her to follow-up with her dental provider.  We will plan to discharge.  Patient's presentation is most consistent with acute complicated illness / injury requiring diagnostic workup.   Provided the patient with anticipatory guidance, return precautions, and educational material. Encouraged the patient to return to the emergency department at any time if they begin to experience any new or worsening symptoms. Patient expressed understanding and agreed with the plan.       FINAL  CLINICAL IMPRESSION(S) / ED DIAGNOSES   Final diagnoses:  Facial pain     Rx / DC Orders   ED Discharge Orders          Ordered    meloxicam (MOBIC) 15 MG tablet  Daily        01/06/22 1843             Note:  This document was prepared using Dragon voice recognition software and may include unintentional dictation errors.   Teodoro Spray, [place] 01/06/22 1847    Rada Hay, MD 01/06/22 2004

## 2022-01-06 NOTE — ED Notes (Signed)
Pt reports left face swelling. Pt states "I have bad teeth." Pt reports ongoing problems with teeth and states that she is scheduled to get them removed in August. Pt reports taking IBU continuously with minimal relief in mouth pain. Pt also complains of a bump in her left nostril. Pt states that she has had a bump there before that had to be drained and was caused by teeth per her doctor. Pt given a warm blanket, call light within reach.

## 2022-01-06 NOTE — ED Triage Notes (Signed)
Pt via POV from home. Pt c/o L sided facial pain for the past 4 days. States that she is scheduled to get all her teeth removed in August, pt was seen at [facility] this AM and they gave her antibiotics but states she has already been on rounds of antibiotics previously with no relief of the pain. Pt is A&Ox4 and NAD

## 2022-01-06 NOTE — Discharge Instructions (Addendum)
-  Continue taking your antibiotics as prescribed. -You may take meloxicam and acetaminophen as needed for pain. -Follow-up with your dental provider as discussed. -Return to the emergency department anytime if you begin to experience any new or worsening symptoms.

## 2022-01-18 ENCOUNTER — Other Ambulatory Visit: Payer: Self-pay

## 2022-01-18 ENCOUNTER — Emergency Department
Admission: EM | Admit: 2022-01-18 | Discharge: 2022-01-18 | Disposition: A | Discharge status: Home / Self Care | Payer: Medicaid Other | Attending: Emergency Medicine | Admitting: Emergency Medicine

## 2022-01-18 DIAGNOSIS — K0889 Other specified disorders of teeth and supporting structures: Secondary | ICD-10-CM | POA: Diagnosis not present

## 2022-01-18 DIAGNOSIS — R220 Localized swelling, mass and lump, head: Secondary | ICD-10-CM | POA: Diagnosis present

## 2022-01-18 MED ORDER — CLINDAMYCIN HCL 150 MG PO CAPS
300.0000 mg | ORAL_CAPSULE | Freq: Once | ORAL | Status: AC
  Administered 2022-01-18: 300 mg via ORAL
  Filled 2022-01-18: qty 2

## 2022-01-18 MED ORDER — CLINDAMYCIN HCL 300 MG PO CAPS: 300.0000 mg | ORAL_CAPSULE | Freq: Three times a day (TID) | ORAL | 0 refills | Status: AC

## 2022-01-18 MED ORDER — KETOROLAC TROMETHAMINE 30 MG/ML IJ SOLN
30.0000 mg | Freq: Once | INTRAMUSCULAR | Status: AC
  Administered 2022-01-18: 30 mg via INTRAMUSCULAR
  Filled 2022-01-18: qty 1

## 2022-01-18 NOTE — ED Provider Notes (Signed)
[facility] Provider Note  Patient Contact: 10:06 PM (approximate)   History   Oral Swelling (Abscess )   HPI  Nicole Kaufman is a 31 y.o. female presents to the emergency department with left lower dental pain for the past 2 days.  Patient states that she has an appointment with her dentist but is not until the 21st.  She has been taking amoxicillin with little relief.  No difficulty swallowing or maintaining her own secretions.      Physical Exam   Triage Vital Signs: ED Triage Vitals  Enc Vitals Group     BP 01/18/22 2151 (!) 156/113     Pulse Rate 01/18/22 2151 79     Resp 01/18/22 2151 19     Temp 01/18/22 2151 98.2 F (36.8 C)     Temp Source 01/18/22 2151 Oral     SpO2 01/18/22 2151 100 %     Weight --      Height --      Head Circumference --      Peak Flow --      Pain Score 01/18/22 2152 10     Pain Loc --      Pain Edu? --      Excl. in GC? --     Most recent vital signs: Vitals:   01/18/22 2151  BP: (!) 156/113  Pulse: 79  Resp: 19  Temp: 98.2 F (36.8 C)  SpO2: 100%     General: Alert and in no acute distress. Eyes:  PERRL. EOMI. Head: No acute traumatic findings ENT:      Ears: Tms are pearly.       Nose: No congestion/rhinnorhea.      Mouth/Throat: Mucous membranes are moist.  Patient has some swelling of the left lower jaw. Neck: No stridor. No cervical spine tenderness to palpation. Cardiovascular:  Good peripheral perfusion Respiratory: Normal respiratory effort without tachypnea or retractions. Lungs CTAB. Good air entry to the bases with no decreased or absent breath sounds. Gastrointestinal: Bowel sounds 4 quadrants. Soft and nontender to palpation. No guarding or rigidity. No palpable masses. No distention. No CVA tenderness. Musculoskeletal: Full range of motion to all extremities.  Neurologic:  No gross focal neurologic deficits are appreciated.  Skin:   No rash noted Other:   ED Results /  Procedures / Treatments   Labs (all labs ordered are listed, but only abnormal results are displayed) Labs Reviewed - No data to display    PROCEDURES:  Critical Care performed: No  Procedures   MEDICATIONS ORDERED IN ED: Medications  ketorolac (TORADOL) 30 MG/ML injection 30 mg (has no administration in time range)  clindamycin (CLEOCIN) capsule 300 mg (has no administration in time range)     IMPRESSION / MDM / ASSESSMENT AND PLAN / ED COURSE  I reviewed the triage vital signs and the nursing notes.                              Assessment and plan Dental pain 31 year old female presents to the emergency department with pain and swelling of the left lower jaw.  Will start patient on clindamycin until she can be seen by her dentist.  Patient was given an injection of Toradol prior to discharge.     FINAL CLINICAL IMPRESSION(S) / ED DIAGNOSES   Final diagnoses:  Pain, dental     Rx / DC Orders   ED Discharge  Orders          Ordered    clindamycin (CLEOCIN) 300 MG capsule  3 times daily        01/18/22 2204             Note:  This document was prepared using Dragon voice recognition software and may include unintentional dictation errors.   Pia [place], Cordelia Poche 01/18/22 2208    Georga Hacking, MD 01/19/22 [phone]

## 2022-01-18 NOTE — Discharge Instructions (Signed)
Take Clindamycin three times daily for 10 days. Keep follow up appointment with your dentist.

## 2022-01-18 NOTE — ED Triage Notes (Signed)
Pt presents to ER c/o left side facial pain x1 week with swelling that started in the last 2 days.  Pt states she has abscess in left lower mouth/gums.  Pt denis sob.  States she was taking amoxicillin but is not currently on any meds.  Pt A&O x4 at this time in NAD in triage.

## 2022-09-04 IMAGING — CT CT MAXILLOFACIAL W/O CM
3 of 6 series · 16 of 47 positions shown, 19 images · non-contrast
Comparison: None Available.

CLINICAL DATA: Suspected nasal abscess. Left-sided facial pain for
4 days.



[Series 2: maxilllofacial 2.0 hr40 3 · axial · 0.31mm/px · z∈[-171,-43]mm · 11 of 76 slices shown, 14 images]
[im 6/76  brain]
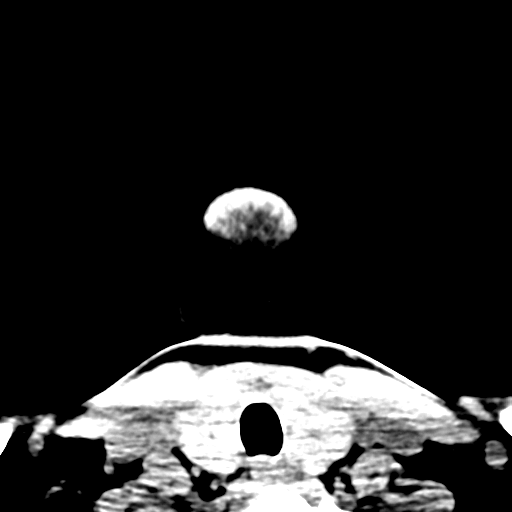
[im 6/76  bone]
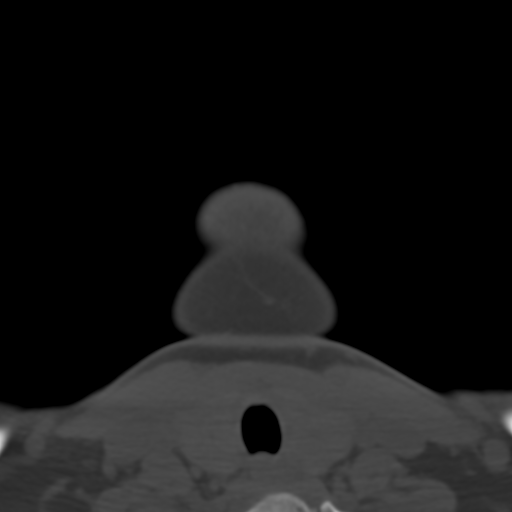
[im 11/76  bone]
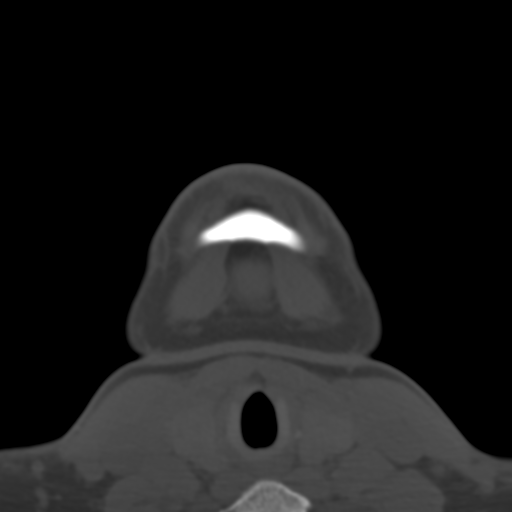
[im 17/76  bone]
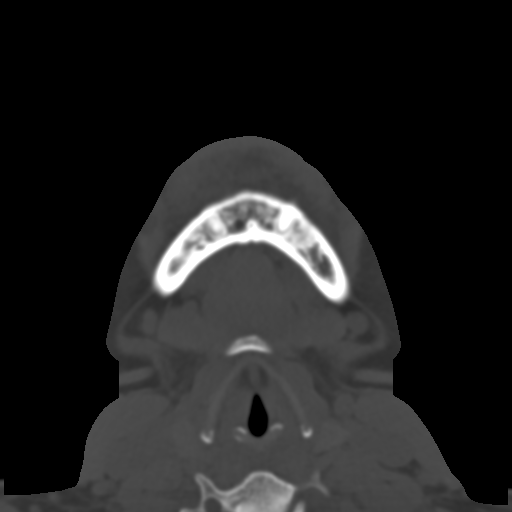
[im 27/76  bone]
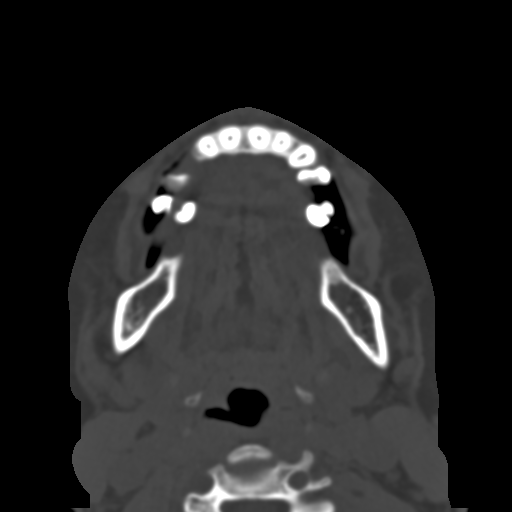
[im 33/76  brain]
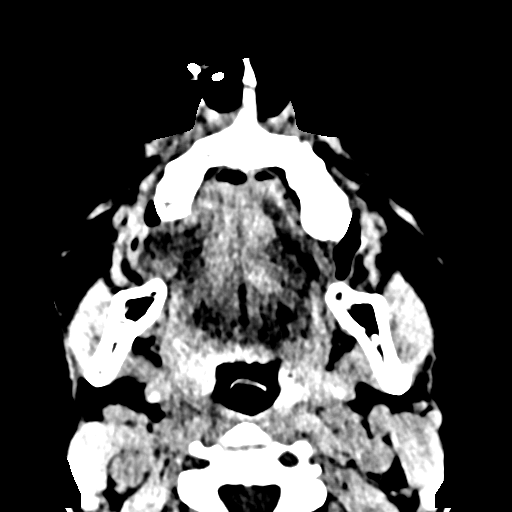
[im 33/76  bone]
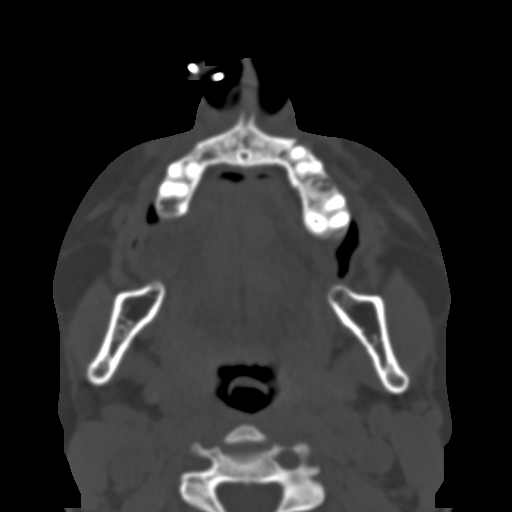
[im 38/76  bone]
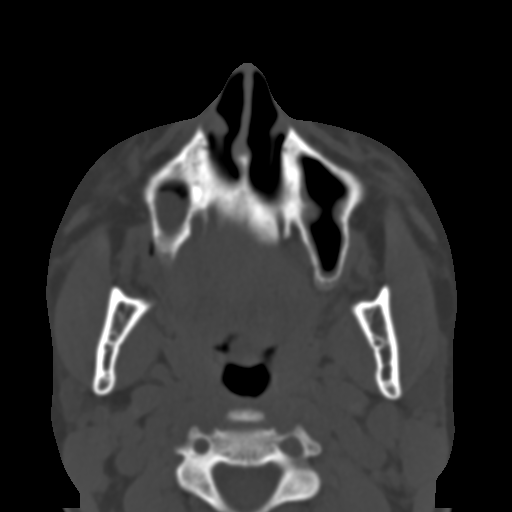
[im 43/76  bone]
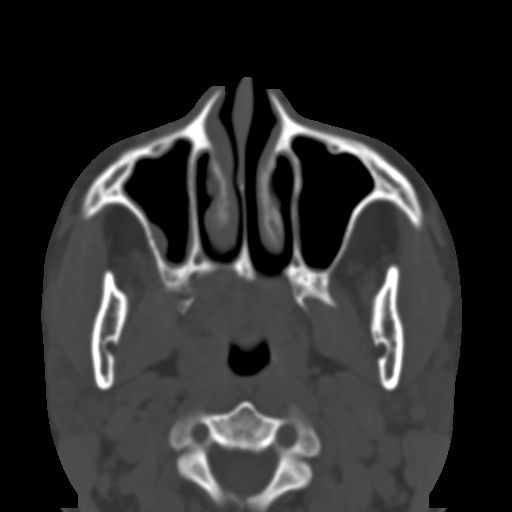
[im 49/76  bone]
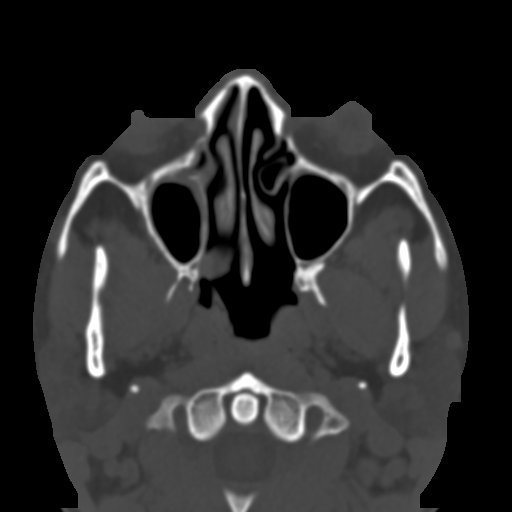
[im 59/76  brain]
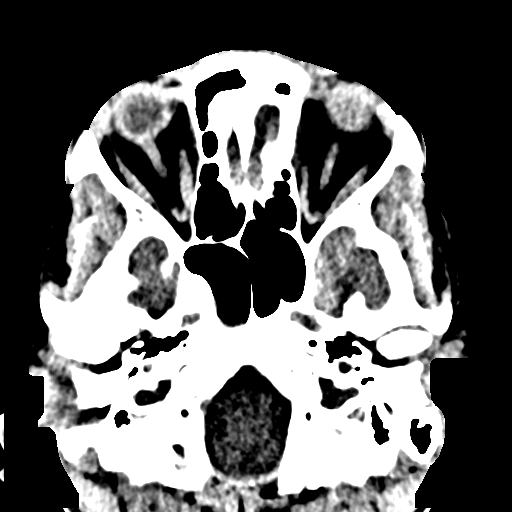
[im 59/76  bone]
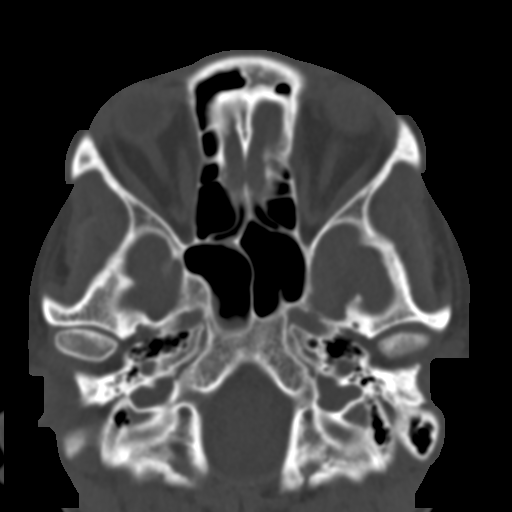
[im 65/76  bone]
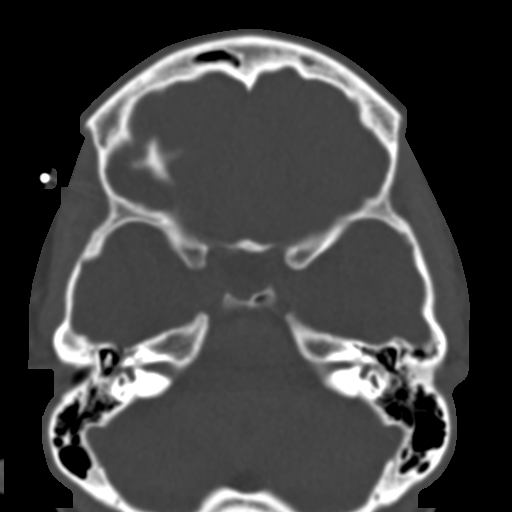
[im 70/76  bone]
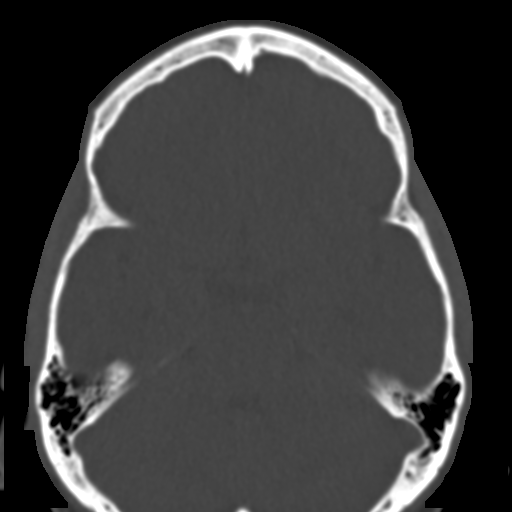

[Series 8: bone cor · coronal · 0.32mm/px · 3 of 84 slices shown]
[im 21/84  bone]
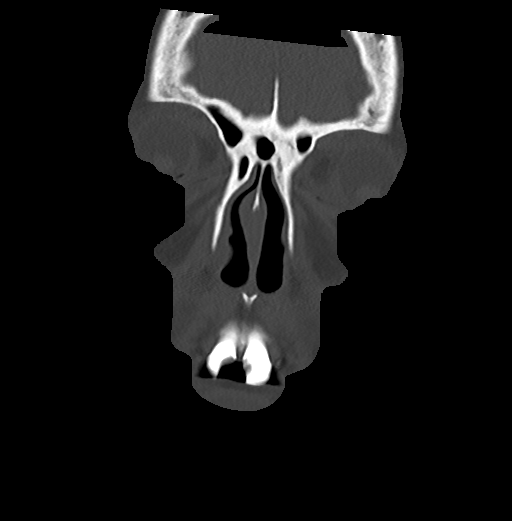
[im 42/84  bone]
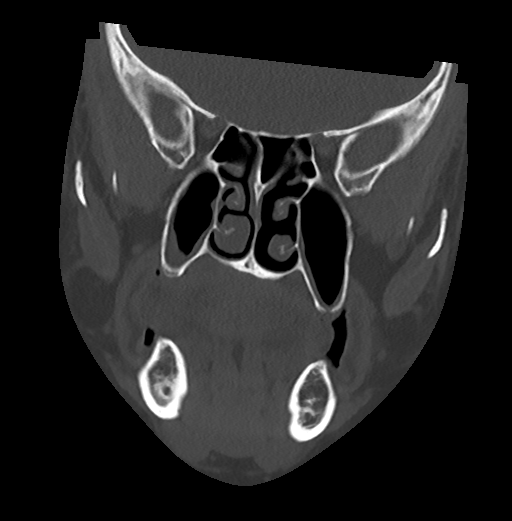
[im 63/84  bone]
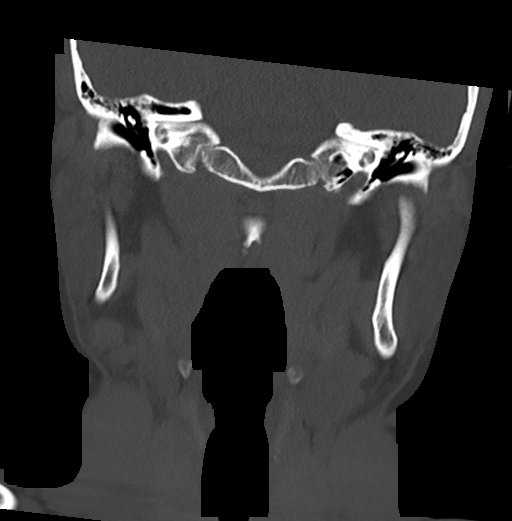

[Series 9: bone sag · sagittal · 0.36mm/px · 2 of 84 slices shown]
[im 28/84  bone]
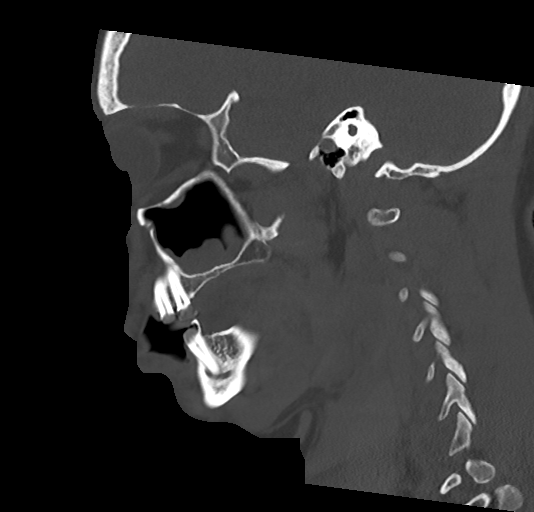
[im 56/84  bone]
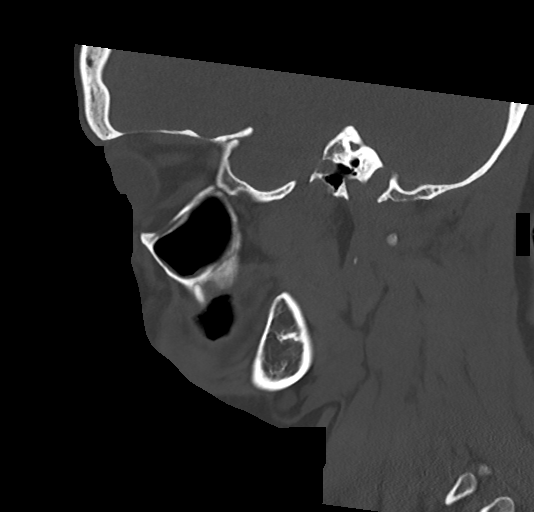

[16 of 47 positions shown; findings below may reference images not displayed]

FINDINGS: Osseous: No fracture or mandibular dislocation. No destructive
process. Multiple missing teeth and dental caries. Periapical
lucency about the left upper first molar.

Orbits: Negative. No traumatic or inflammatory finding.

Sinuses: Mucosal thickening of the right greater than left maxillary
sinus. Mucosal thickening of right side of sphenoid sinus. No sinus
fluid levels. No cortical thickening or bone destruction.

Soft tissues: No soft tissue fluid collection on this unenhanced
exam. There may be mild infranasal soft tissue edema that is
symmetric. No soft tissue gas. Right nasal ring in place.

Limited intracranial: No significant or unexpected finding.
IMPRESSION: 1. Multiple missing teeth and dental caries. Periapical lucency
about the left upper first molar.
2. No evidence of soft tissue fluid collection on this unenhanced
exam. There may be mild infranasal soft tissue edema that is
symmetric.
3. Mucosal thickening of the right greater than left maxillary and
right sphenoid sinuses. No sinus fluid levels.
